# Patient Record
Sex: Female | Born: 1990 | Race: Black or African American | Hispanic: No | Marital: Single | Smoking: Current every day smoker
Health system: Southern US, Community
[De-identification: ages and names within clinical notes are randomized; demographics above are authoritative.]

## PROBLEM LIST (undated history)

## (undated) DIAGNOSIS — F319 Bipolar disorder, unspecified: Secondary | ICD-10-CM

## (undated) DIAGNOSIS — Z789 Other specified health status: Secondary | ICD-10-CM

## (undated) HISTORY — PX: NO PAST SURGERIES: SHX2092

---
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
## 2012-09-20 ENCOUNTER — Inpatient Hospital Stay (HOSPITAL_COMMUNITY)
Admission: AD | Admit: 2012-09-20 | Discharge: 2012-09-20 | Disposition: A | Discharge status: Home / Self Care | Payer: Self-pay | Source: Ambulatory Visit | Attending: Family Medicine | Admitting: Family Medicine

## 2012-09-20 ENCOUNTER — Inpatient Hospital Stay (HOSPITAL_COMMUNITY): Payer: Self-pay

## 2012-09-20 ENCOUNTER — Encounter (HOSPITAL_COMMUNITY): Payer: Self-pay | Admitting: *Deleted

## 2012-09-20 DIAGNOSIS — N949 Unspecified condition associated with female genital organs and menstrual cycle: Secondary | ICD-10-CM | POA: Insufficient documentation

## 2012-09-20 DIAGNOSIS — T8339XA Other mechanical complication of intrauterine contraceptive device, initial encounter: Secondary | ICD-10-CM | POA: Insufficient documentation

## 2012-09-20 DIAGNOSIS — T8332XA Displacement of intrauterine contraceptive device, initial encounter: Secondary | ICD-10-CM

## 2012-09-20 DIAGNOSIS — R109 Unspecified abdominal pain: Secondary | ICD-10-CM | POA: Insufficient documentation

## 2012-09-20 HISTORY — DX: Other specified health status: Z78.9

## 2012-09-20 LAB — URINALYSIS, ROUTINE W REFLEX MICROSCOPIC
Glucose, UA: NEGATIVE mg/dL
Leukocytes, UA: NEGATIVE
Nitrite: NEGATIVE
Protein, ur: NEGATIVE mg/dL

## 2012-09-20 LAB — URINE MICROSCOPIC-ADD ON

## 2012-09-20 LAB — WET PREP, GENITAL
Clue Cells Wet Prep HPF POC: NONE SEEN
Trich, Wet Prep: NONE SEEN
Yeast Wet Prep HPF POC: NONE SEEN

## 2012-09-20 LAB — POCT PREGNANCY, URINE: Preg Test, Ur: NEGATIVE

## 2012-09-20 NOTE — Progress Notes (Signed)
Written and verbal d/c instructions given and understanding voiced. Dr Thad Ranger went over d/c instructions in detail with pt. Clinic will call pt with appt.

## 2012-09-20 NOTE — MAU Note (Signed)
Pt did not have transportation home. Came in by EMS. Given bus pass. Security escorted pt to bus stop at [facility].
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
## 2012-09-20 NOTE — MAU Note (Signed)
Pain since Jan, in March had Mirena placed.abortion in Jan.  Pain has continued. Did not have follow up for either procedure due to transportation problems.  Pain is in lower   On left side.  Has had bleeding offf and on, at times passing clumps of tissue.  Can not feel strings, but never has.

## 2012-09-21 ENCOUNTER — Encounter: Payer: Self-pay | Admitting: Obstetrics & Gynecology

## 2012-09-21 LAB — GC/CHLAMYDIA PROBE AMP, GENITAL: Chlamydia, DNA Probe: NEGATIVE

## 2012-10-05 ENCOUNTER — Encounter: Payer: Self-pay | Admitting: Obstetrics & Gynecology

## 2014-07-15 IMAGING — US US PELVIS COMPLETE
1 series · 13 of 25 positions shown · non-contrast
Comparison: None

CLINICAL DATA: Left-sided pelvic pain.  Indwelling IUD, though the
string is not visible at physical examination.

TRANSABDOMINAL AND TRANSVAGINAL ULTRASOUND OF PELVIS
TECHNIQUE: Both transabdominal and transvaginal ultrasound
examinations of the pelvis were performed. Transabdominal technique
was performed for global imaging of the pelvis including uterus,
ovaries, adnexal regions, and pelvic cul-de-sac.
It was necessary to proceed with endovaginal exam following the
transabdominal exam to visualize the endometrium to localize the
IUD.

[Series 1: us pelvis complete · 47 acquisitions, 13 frames shown]
[im 1/47]
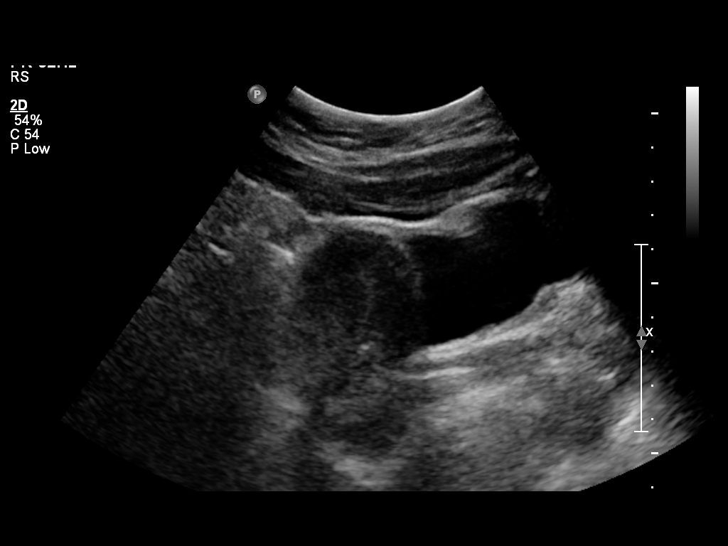
[im 4/47]
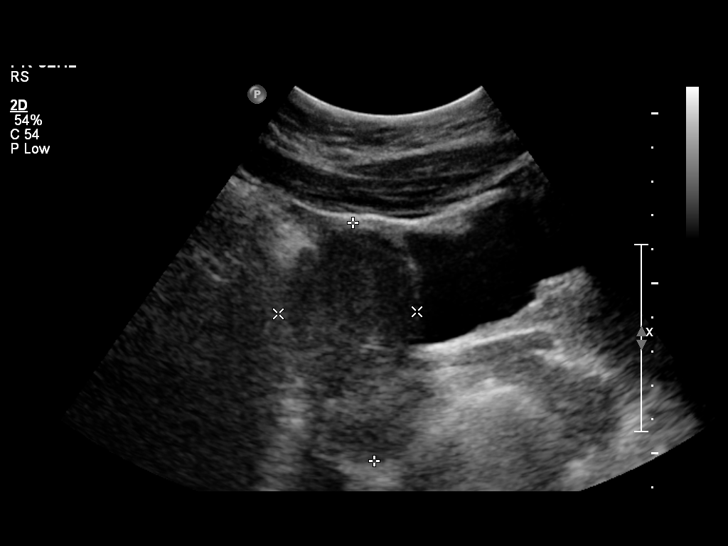
[im 8/47]
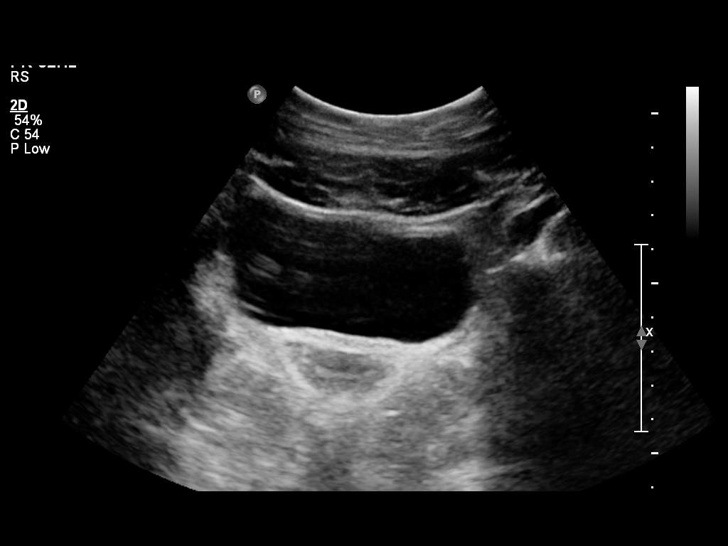
[im 12/47]
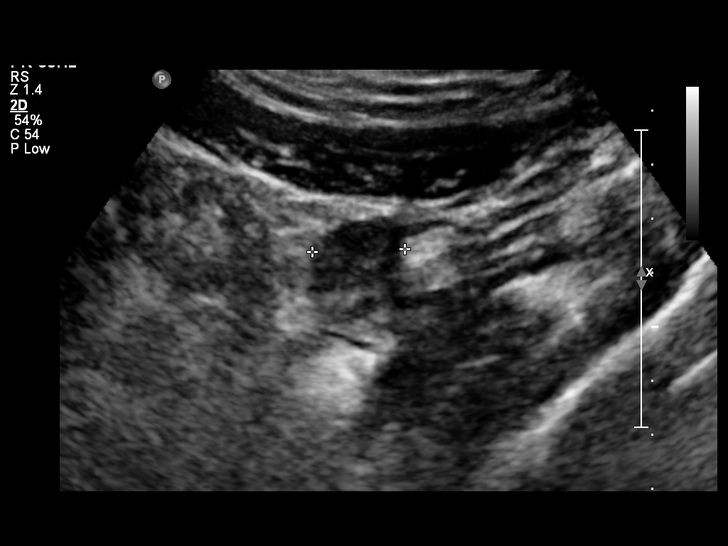
[im 16/47]
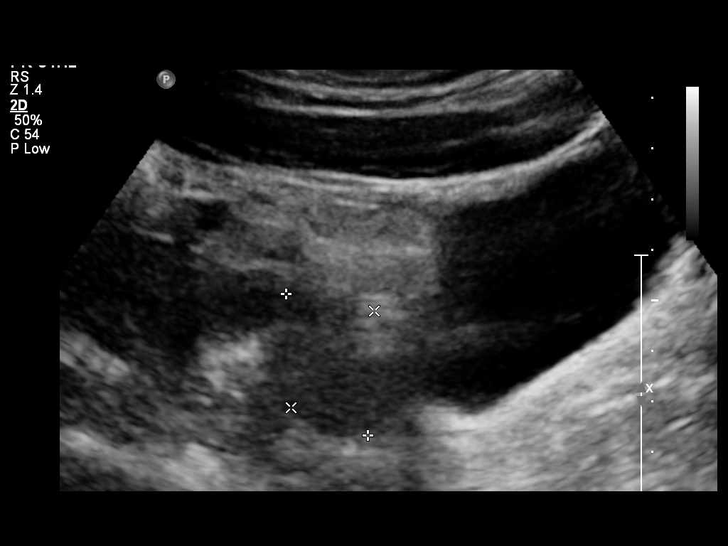
[im 20/47]
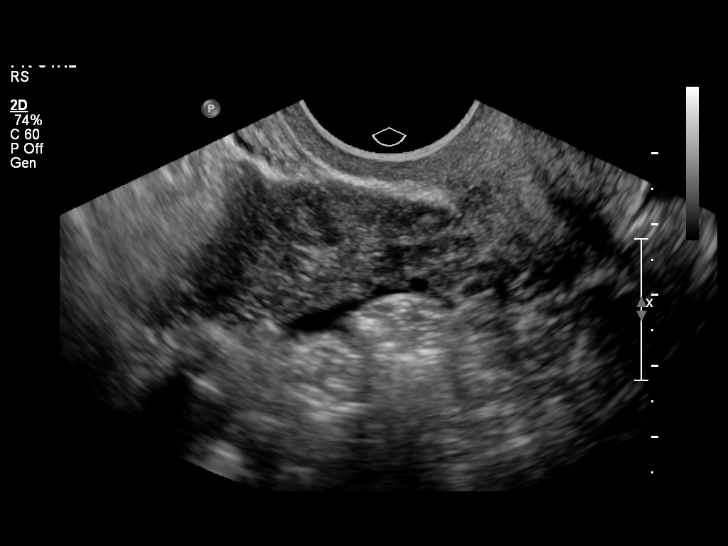
[im 24/47]
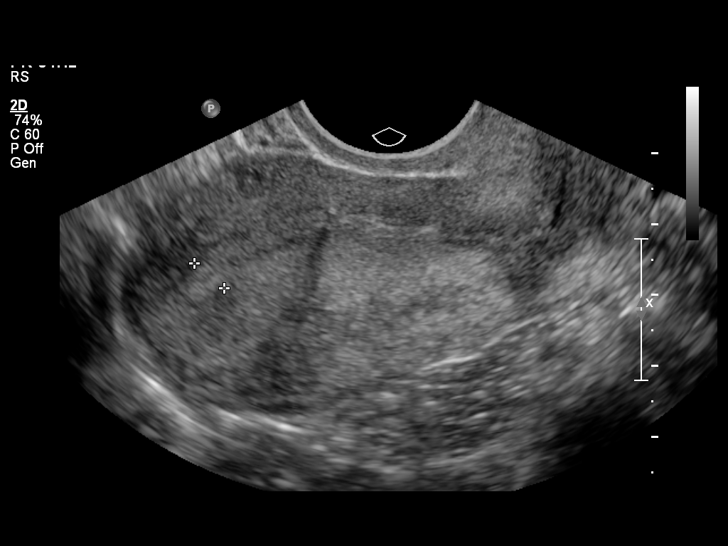
[im 27/47]
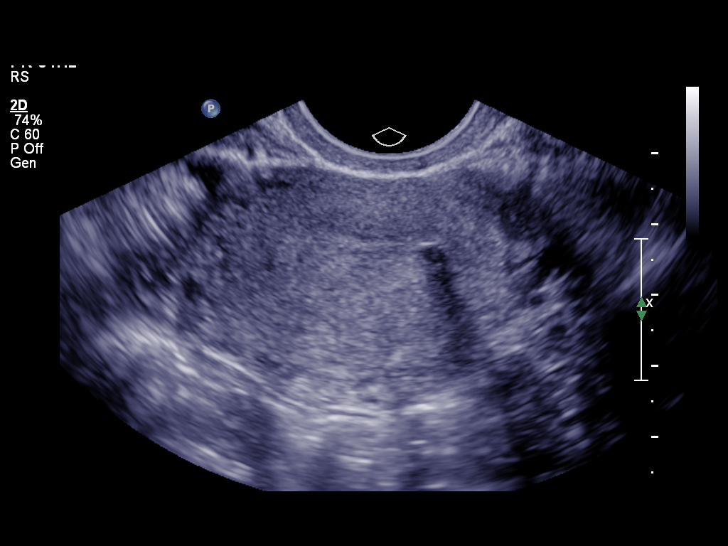
[im 31/47]
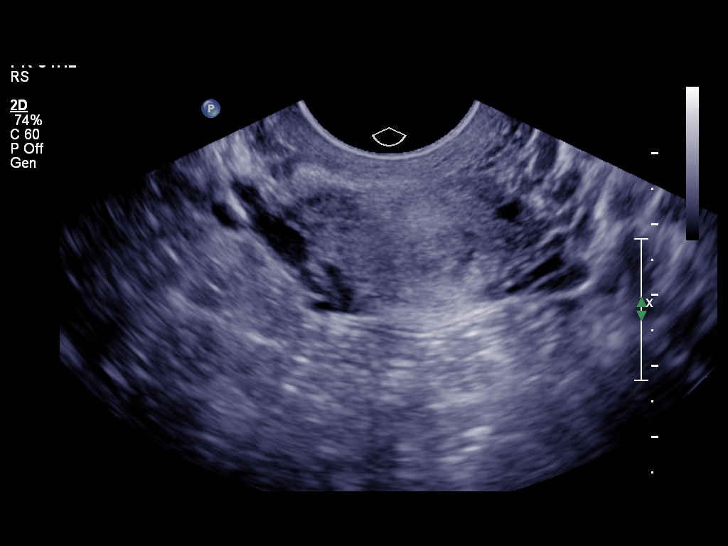
[im 35/47]
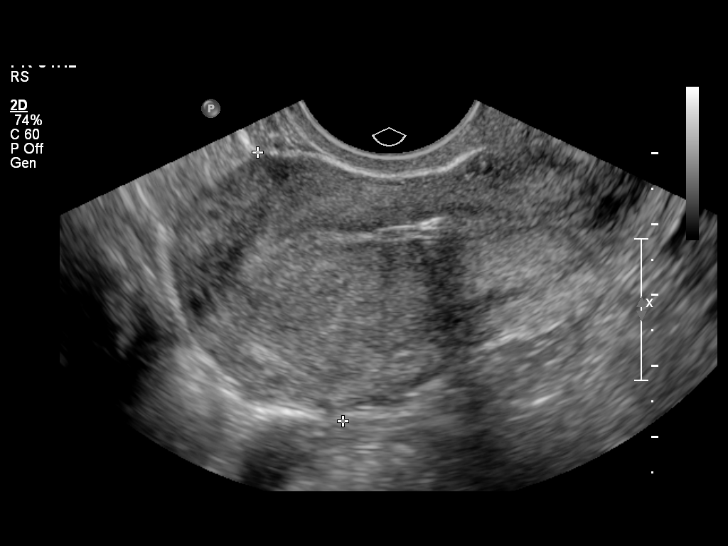
[im 39/47]
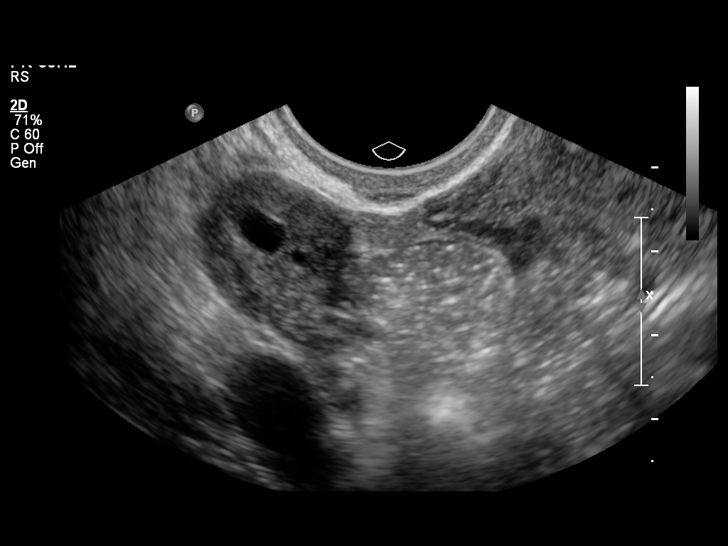
[im 43/47]
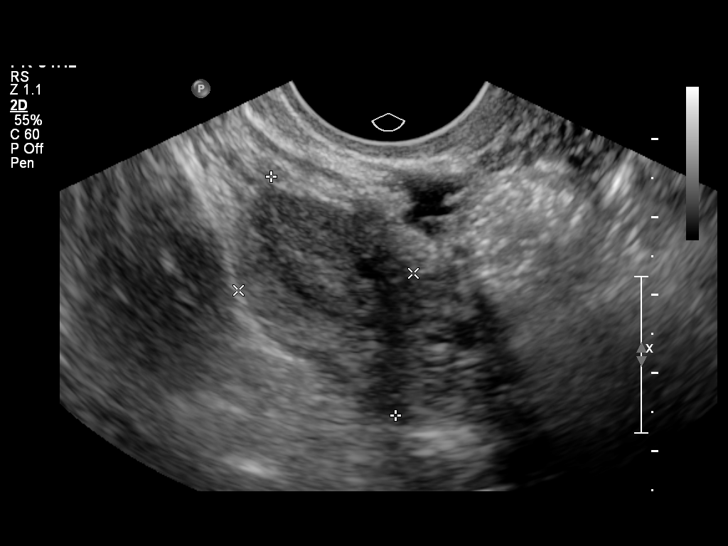
[im 47/47]
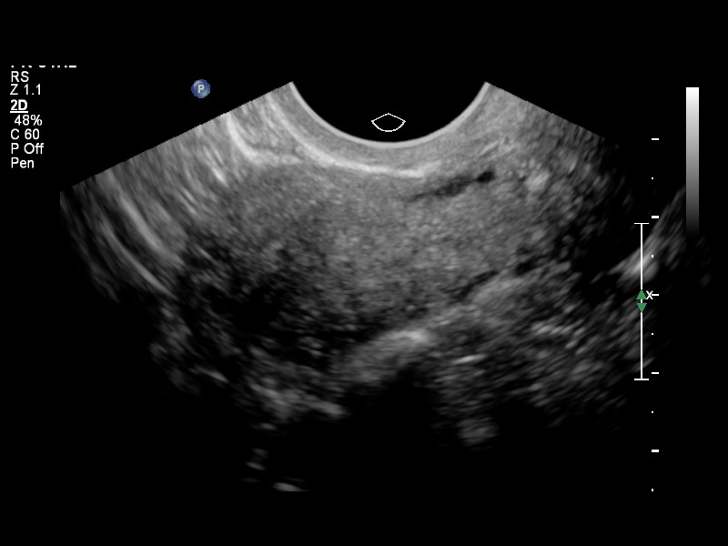

[13 of 25 positions shown; findings below may reference images not displayed]

FINDINGS: Uterus: Normal in size appearance measuring 7.0 x 4.1 x 5.6 cm.  No
focal myometrial abnormality.

Endometrium: Normal in thickness measuring 6 mm.  Intrauterine
device within the endometrial canal of the lower uterine segment,
not in the fundus.  This is very well demonstrated on the 3-D
reconstruction where there is suggestion that the right arm of the
IUD may be within the myometrium of the right lateral uterine body.

Right ovary:  Normal in size and appearance containing small
follicular cysts, measuring approximately 2.4 x 1.9 x 1.7 cm.
Normal power Doppler flow.

Left ovary: Normal in size and appearance containing small
follicular cysts, measuring approximately 3.5 x 2.3 x 2.1 cm.
Normal power Doppler flow.

Other findings: Trace, physiologic free fluid in the cul-de-sac.
IMPRESSION: 1.  Intrauterine device abnormally positioned within the
endometrial canal of the lower uterine segment.  3-D images raise
the question of whether the right arm of the IUD has migrated into
the myometrium of the right lateral uterine body.
2.  Otherwise normal examination.

## 2014-09-17 ENCOUNTER — Encounter (HOSPITAL_COMMUNITY): Payer: Self-pay | Admitting: *Deleted

## 2016-06-01 ENCOUNTER — Emergency Department (HOSPITAL_COMMUNITY)
Admission: EM | Admit: 2016-06-01 | Discharge: 2016-06-01 | Disposition: A | Discharge status: Other Acute Inpatient Hospital | Payer: Self-pay | Attending: Emergency Medicine | Admitting: Emergency Medicine

## 2016-06-01 ENCOUNTER — Encounter (HOSPITAL_COMMUNITY): Payer: Self-pay | Admitting: *Deleted

## 2016-06-01 ENCOUNTER — Encounter (HOSPITAL_COMMUNITY): Payer: Self-pay

## 2016-06-01 ENCOUNTER — Inpatient Hospital Stay (HOSPITAL_COMMUNITY)
Admission: AD | Admit: 2016-06-01 | Discharge: 2016-06-08 | DRG: 885 | Disposition: A | Discharge status: Home / Self Care | Payer: BLUE CROSS/BLUE SHIELD | Attending: Psychiatry | Admitting: Psychiatry

## 2016-06-01 DIAGNOSIS — F319 Bipolar disorder, unspecified: Secondary | ICD-10-CM | POA: Insufficient documentation

## 2016-06-01 DIAGNOSIS — F2089 Other schizophrenia: Secondary | ICD-10-CM | POA: Insufficient documentation

## 2016-06-01 DIAGNOSIS — Z818 Family history of other mental and behavioral disorders: Secondary | ICD-10-CM | POA: Diagnosis not present

## 2016-06-01 DIAGNOSIS — F312 Bipolar disorder, current episode manic severe with psychotic features: Secondary | ICD-10-CM | POA: Diagnosis present

## 2016-06-01 DIAGNOSIS — F17210 Nicotine dependence, cigarettes, uncomplicated: Secondary | ICD-10-CM | POA: Insufficient documentation

## 2016-06-01 DIAGNOSIS — F1210 Cannabis abuse, uncomplicated: Secondary | ICD-10-CM | POA: Diagnosis present

## 2016-06-01 DIAGNOSIS — Z79899 Other long term (current) drug therapy: Secondary | ICD-10-CM | POA: Insufficient documentation

## 2016-06-01 DIAGNOSIS — Z809 Family history of malignant neoplasm, unspecified: Secondary | ICD-10-CM

## 2016-06-01 DIAGNOSIS — Z833 Family history of diabetes mellitus: Secondary | ICD-10-CM

## 2016-06-01 DIAGNOSIS — G4700 Insomnia, unspecified: Secondary | ICD-10-CM | POA: Diagnosis present

## 2016-06-01 DIAGNOSIS — F1290 Cannabis use, unspecified, uncomplicated: Secondary | ICD-10-CM | POA: Insufficient documentation

## 2016-06-01 HISTORY — DX: Bipolar disorder, unspecified: F31.9

## 2016-06-01 LAB — SALICYLATE LEVEL: Salicylate Lvl: <4 mg/dL (ref 2.8–30.0)

## 2016-06-01 LAB — COMPREHENSIVE METABOLIC PANEL
ALBUMIN: 5.1 g/dL — AB (ref 3.5–5.0)
ALT: 22 U/L (ref 14–54)
ANION GAP: 12 (ref 5–15)
AST: 44 U/L — ABNORMAL HIGH (ref 15–41)
Alkaline Phosphatase: 49 U/L (ref 38–126)
BILIRUBIN TOTAL: 1.1 mg/dL (ref 0.3–1.2)
BUN: 13 mg/dL (ref 6–20)
CALCIUM: 9.7 mg/dL (ref 8.9–10.3)
CO2: 23 mmol/L (ref 22–32)
Chloride: 103 mmol/L (ref 101–111)
Creatinine, Ser: 0.82 mg/dL (ref 0.44–1.00)
GFR calc non Af Amer: >60 mL/min (ref >60)
GLUCOSE: 99 mg/dL (ref 65–99)
Potassium: 3.3 mmol/L — ABNORMAL LOW (ref 3.5–5.1)
Sodium: 138 mmol/L (ref 135–145)
TOTAL PROTEIN: 8 g/dL (ref 6.5–8.1)

## 2016-06-01 LAB — RAPID URINE DRUG SCREEN, HOSP PERFORMED
Amphetamines: NOT DETECTED
BARBITURATES: NOT DETECTED
Benzodiazepines: NOT DETECTED
COCAINE: NOT DETECTED
Opiates: NOT DETECTED
Tetrahydrocannabinol: POSITIVE — AB

## 2016-06-01 LAB — ACETAMINOPHEN LEVEL

## 2016-06-01 LAB — CBC
HEMATOCRIT: 40.4 % (ref 36.0–46.0)
Hemoglobin: 13.7 g/dL (ref 12.0–15.0)
MCH: 30 pg (ref 26.0–34.0)
MCHC: 33.9 g/dL (ref 30.0–36.0)
MCV: 88.4 fL (ref 78.0–100.0)
Platelets: 184 10*3/uL (ref 150–400)
RBC: 4.57 MIL/uL (ref 3.87–5.11)
RDW: 14.3 % (ref 11.5–15.5)
WBC: 8.6 10*3/uL (ref 4.0–10.5)

## 2016-06-01 LAB — ETHANOL: Alcohol, Ethyl (B): <5 mg/dL (ref <5)

## 2016-06-01 LAB — PREGNANCY, URINE: PREG TEST UR: NEGATIVE

## 2016-06-01 MED ORDER — CARBAMAZEPINE 200 MG PO TABS
200.0000 mg | ORAL_TABLET | Freq: Two times a day (BID) | ORAL | Status: DC
  Administered 2016-06-01 – 2016-06-08 (×14): 200 mg via ORAL
  Filled 2016-06-01 (×12): qty 1
  Filled 2016-06-01: qty 14
  Filled 2016-06-01: qty 1
  Filled 2016-06-01: qty 14
  Filled 2016-06-01 (×2): qty 1
  Filled 2016-06-01: qty 14
  Filled 2016-06-01 (×2): qty 1

## 2016-06-01 MED ORDER — BENZTROPINE MESYLATE 0.5 MG PO TABS
0.5000 mg | ORAL_TABLET | Freq: Two times a day (BID) | ORAL | Status: DC
  Administered 2016-06-01 – 2016-06-08 (×14): 0.5 mg via ORAL
  Filled 2016-06-01 (×13): qty 1
  Filled 2016-06-01 (×2): qty 14
  Filled 2016-06-01 (×2): qty 1
  Filled 2016-06-01: qty 14
  Filled 2016-06-01 (×2): qty 1

## 2016-06-01 MED ORDER — TRAZODONE HCL 100 MG PO TABS: 100.0000 mg | ORAL_TABLET | Freq: Every day | ORAL | Status: DC

## 2016-06-01 MED ORDER — HALOPERIDOL LACTATE 5 MG/ML IJ SOLN
5.0000 mg | Freq: Two times a day (BID) | INTRAMUSCULAR | Status: DC
  Filled 2016-06-01 (×10): qty 1

## 2016-06-01 MED ORDER — MAGNESIUM HYDROXIDE 400 MG/5ML PO SUSP: 30.0000 mL | Freq: Every day | ORAL | Status: DC | PRN

## 2016-06-01 MED ORDER — TRAZODONE HCL 50 MG PO TABS: 50.0000 mg | ORAL_TABLET | Freq: Every day | ORAL | Status: DC

## 2016-06-01 MED ORDER — BENZTROPINE MESYLATE 1 MG/ML IJ SOLN
0.5000 mg | Freq: Two times a day (BID) | INTRAMUSCULAR | Status: DC
  Filled 2016-06-01 (×18): qty 0.5

## 2016-06-01 MED ORDER — VITAMIN D3 25 MCG (1000 UNIT) PO TABS
1000.0000 [IU] | ORAL_TABLET | Freq: Every day | ORAL | Status: DC
  Administered 2016-06-01: 1000 [IU] via ORAL
  Filled 2016-06-01: qty 1

## 2016-06-01 MED ORDER — CARBAMAZEPINE 200 MG PO TABS: 200.0000 mg | ORAL_TABLET | Freq: Two times a day (BID) | ORAL | Status: DC

## 2016-06-01 MED ORDER — ADULT MULTIVITAMIN W/MINERALS CH
1.0000 | ORAL_TABLET | Freq: Every day | ORAL | Status: DC
  Administered 2016-06-01: 1 via ORAL
  Filled 2016-06-01: qty 1

## 2016-06-01 MED ORDER — BENZTROPINE MESYLATE 1 MG PO TABS
0.5000 mg | ORAL_TABLET | Freq: Two times a day (BID) | ORAL | Status: DC
Start: 2016-06-01 — End: 2016-06-01
  Administered 2016-06-01: 0.5 mg via ORAL
  Filled 2016-06-01: qty 1

## 2016-06-01 MED ORDER — TRAZODONE HCL 100 MG PO TABS
100.0000 mg | ORAL_TABLET | Freq: Every day | ORAL | Status: DC
  Filled 2016-06-01 (×7): qty 1

## 2016-06-01 MED ORDER — HALOPERIDOL 5 MG PO TABS
5.0000 mg | ORAL_TABLET | Freq: Two times a day (BID) | ORAL | Status: DC
Start: 2016-06-01 — End: 2016-06-04
  Administered 2016-06-01 – 2016-06-04 (×7): 5 mg via ORAL
  Filled 2016-06-01 (×11): qty 1

## 2016-06-01 MED ORDER — HALOPERIDOL 5 MG PO TABS
5.0000 mg | ORAL_TABLET | Freq: Two times a day (BID) | ORAL | Status: DC
  Administered 2016-06-01: 5 mg via ORAL
  Filled 2016-06-01: qty 1

## 2016-06-01 MED ORDER — ACETAMINOPHEN 325 MG PO TABS: 650.0000 mg | ORAL_TABLET | Freq: Four times a day (QID) | ORAL | Status: DC | PRN

## 2016-06-01 MED ORDER — ALUM & MAG HYDROXIDE-SIMETH 200-200-20 MG/5ML PO SUSP: 30.0000 mL | ORAL | Status: DC | PRN

## 2016-06-01 MED ORDER — LEVOTHYROXINE SODIUM 25 MCG PO TABS: 25.0000 ug | ORAL_TABLET | Freq: Every day | ORAL | Status: DC

## 2016-06-01 NOTE — ED Notes (Signed)
TTS at beside currently

## 2016-06-01 NOTE — ED Notes (Signed)
Report to Darel [place]Judy [place], patient currently with TTS

## 2016-06-01 NOTE — Tx Team (Signed)
Initial Interdisciplinary Treatment Plan   PATIENT STRESSORS: Legal issue Medication change or noncompliance   PATIENT STRENGTHS: Ability for insight Average or above average intelligence General fund of knowledge Physical Health Religious Affiliation   PROBLEM LIST: Problem List/Patient Goals Date to be addressed Date deferred Reason deferred Estimated date of resolution  "spirituality" 06/01/16     psychosis 06/01/16                                                DISCHARGE CRITERIA:  Ability to meet basic life and health needs Adequate post-discharge living arrangements Improved stabilization in mood, thinking, and/or behavior Motivation to continue treatment in a less acute level of care Need for constant or close observation no longer present Reduction of life-threatening or endangering symptoms to within safe limits Safe-care adequate arrangements made Verbal commitment to aftercare and medication compliance  PRELIMINARY DISCHARGE PLAN: Outpatient therapy Placement in alternative living arrangements  PATIENT/FAMIILY INVOLVEMENT: This treatment plan has been presented to and reviewed with the patient, Grace Morris, and/or family member,   The patient and family have been given the opportunity to ask questions and make suggestions.  Beatrix ShipperWright, Grace Morris 06/01/2016, 7:34 PM

## 2016-06-01 NOTE — ED Notes (Signed)
Mother, [name], can be reached at phone number [phone]

## 2016-06-01 NOTE — ED Notes (Signed)
Patient has been wanded. 

## 2016-06-01 NOTE — BH Assessment (Signed)
Requested urine sample from patient who states "satan get thee behind me, I know I peed earlier." Requested a urine sample again and walked patient to the restroom. Patient provided small sample which was given to the nurse.   Davina PokeJoVea Miken Stecher, LCSW Therapeutic Triage Specialist [facility] 06/01/2016 5:19 AM

## 2016-06-01 NOTE — ED Notes (Signed)
Patient states that was [place] tonight [place].  Patient states that had a disagreement with her mother and she stated that "just let my mother call the police, I love her and she knows what is best for me"  Patient denies A/V/H, patient also states that not having SI thoughts but, is tired and knows she needs to get some rest.

## 2016-06-01 NOTE — ED Provider Notes (Signed)
CSN: [id number]     Arrival date & time 06/01/16  0126 History  By signing my name below, I, Bethel Born, attest that this documentation has been prepared under the direction and in the presence of Shon Baton, MD. Electronically Signed: Bethel Born, ED Scribe. 06/01/2016. 2:38 AM   Chief Complaint  Patient presents with  . Mental Health Problem   The history is provided by the patient. No language interpreter was used.   Brought in by [employer], SWAY GUTTIERREZ is a 25 y.o. female with history of bipolar disorder who presents to the Emergency Department for psychiatric evaluation after her mother called the police because she was being loud and disorderly on the front lawn. Pt states that she and her mother had a verbal disagreement and her mother called the police because she knows that she is tired and needs to get some rest. Pt states that she has is trying to serve [employer] as a Jehov[employer]'s Witness buy has "impure thoughts a lot" and is unable to "think straight".  Pt denies any pain, SI/HI, and auditory hallucinations. She also denies any alcohol or drug use.   I have reviewed the IVC paperwork filled out by her mother. Patient has a history of paranoid schizophrenia.  Recent multiple hospitalizations. Prior suicide attempts. Per the mother based on the IVC paperwork, patient stated "you're the devil and I'm going to kill you." Patient denies any hallucinations to me.  Past Medical History  Diagnosis Date  . No pertinent past medical history   . Bipolar disorder [facility])    Past Surgical History  Procedure Laterality Date  . No past surgeries     Family History  Problem Relation Age of Onset  . Other Neg Hx   . Cancer Paternal Uncle   . Diabetes Paternal Grandmother    Social History  Substance Use Topics  . Smoking status: Current Every Day Smoker  . Smokeless tobacco: Never Used  . Alcohol Use: Yes     Comment: 1 drink every month   OB History    Gravida Para Term  Preterm AB TAB SAB Ectopic Multiple Living   1    1  0   0     Review of Systems  Psychiatric/Behavioral: Positive for behavioral problems and confusion. Negative for suicidal ideas and hallucinations. The patient is not nervous/anxious.   All other systems reviewed and are negative.   Allergies  Review of patient's allergies indicates no known allergies.  Home Medications   Prior to Admission medications   Medication Sig Start Date End Date Taking? Authorizing Provider  Biotin 10 MG CAPS Take 1 tablet by mouth daily.    Historical Provider, MD  fish oil-omega-3 fatty acids 1000 MG capsule Take 2 g by mouth daily.    Historical Provider, MD  Multiple Vitamins-Minerals (MULTIVITAMIN WITH MINERALS) tablet Take 1 tablet by mouth daily.    Historical Provider, MD   BP 111/81 mmHg  Pulse 77  Temp(Src) 98 F (36.7 C) (Oral)  Resp 18  SpO2 100% Physical Exam  Constitutional: She is oriented to person, place, and time. She appears well-developed and well-nourished. No distress.  HENT:  Head: Normocephalic and atraumatic.  Cardiovascular: Normal rate, regular rhythm and normal heart sounds.   Pulmonary/Chest: Effort normal and breath sounds normal. No respiratory distress. She has no wheezes.  Neurological: She is alert and oriented to person, place, and time.  Skin: Skin is warm and dry.  Psychiatric: She has a normal  mood and affect.  Calm, cooperative, denies hallucinations, odd affect and repetitively states "I just feel very tired."  Nursing note and vitals reviewed.   ED Course  Procedures (including critical care time) DIAGNOSTIC STUDIES: Oxygen Saturation is 100% on RA,  normal by my interpretation.    COORDINATION OF CARE: 1:55 AM Discussed treatment plan which includes lab work and TTS consult with pt at bedside and pt agreed to plan.  2:37 AM Per IVC paperwork the pt has history of paranoid schizophrenia, hallucinations, and suicide attempt. Tonight the pt reportedly  told her mother that she was the devil and that she was going to kill her.   Labs Review Labs Reviewed  COMPREHENSIVE METABOLIC PANEL - Abnormal; Notable for the following:    Potassium 3.3 (*)    Albumin 5.1 (*)    AST 44 (*)    All other components within normal limits  ACETAMINOPHEN LEVEL - Abnormal; Notable for the following:    Acetaminophen (Tylenol), Serum <10 (*)    All other components within normal limits  ETHANOL  SALICYLATE LEVEL  CBC  URINE RAPID DRUG SCREEN, HOSP PERFORMED    Imaging Review No results found. I have personally reviewed and evaluated these  lab results as part of my medical decision-making.   EKG Interpretation None      MDM   Final diagnoses:  Other schizophrenia (HCC)   Patient with history of schizophrenia. IVC paperwork filled out by mother for aggression and homicidal ideation. Also reports hallucinations. Screening lab work in place. TTS consulted.  Patient medically clear for evaluation.  I personally performed the services described in this documentation, which was scribed in my presence. The recorded information has been reviewed and is accurate.    Shon Batonourtney F Horton, MD 06/01/16 [phone]

## 2016-06-01 NOTE — BH Assessment (Signed)
[facility] Assessment Progress Note  Per Mojeed Akintayo,MD, this pt requires psychiatric hospitalization at this time. Berneice Heinrichina Tate, RN, [facility]. Pt presents under IVC initiated by her mother and upheld by Dr Jannifer FranklinAkintayo, and [facility]. Pt's nurse, Rudean HittDawnaly, has been notified, and agrees to call report to [phone]. Pt is to be transported via [employer].  Doylene Canninghomas Bernette Seeman, MA Triage Specialist [phone]

## 2016-06-01 NOTE — ED Notes (Signed)
Patient noted in room. No complaints, stable, in no acute distress. Reports AH, and SI,  Q15 minute rounds and monitoring via security cameras continue for safety.

## 2016-06-01 NOTE — ED Notes (Signed)
[facility]  All belongings given to the transporting officer.  She left the unit ambulatory with [employer].
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
## 2016-06-02 DIAGNOSIS — F312 Bipolar disorder, current episode manic severe with psychotic features: Principal | ICD-10-CM

## 2016-06-02 LAB — LIPID PANEL
Cholesterol: 123 mg/dL (ref 0–200)
HDL: 34 mg/dL — ABNORMAL LOW (ref >40)
LDL CALC: 77 mg/dL (ref 0–99)
Total CHOL/HDL Ratio: 3.6 RATIO
Triglycerides: 59 mg/dL (ref <150)
VLDL: 12 mg/dL (ref 0–40)

## 2016-06-02 LAB — TSH: TSH: 2.382 u[IU]/mL (ref 0.350–4.500)

## 2016-06-02 NOTE — Progress Notes (Signed)
Pt was isolative and withdrawn to room; was in bed sleep all evening. Pt, a new admit was difficult to arouse. At the time of assessment, Pt wasn't in any distress. Support, encouragement, and safe environment provided.  15-minute safety checks continue.

## 2016-06-02 NOTE — Progress Notes (Signed)
D) Pt has been isolative to room. Seclusive to self. Pt is negative for groups and activities this shift however does go to meals with prompting. Affect is blank, or preoccupied. Pt is guarded about AVH stating "I just need to sleep". Compliant with medication. A) Level 3 obs for safety, support and encouragement provided. Prompts as needed. Med ed reinforced. R) Guarded, seclusive to self.

## 2016-06-02 NOTE — Progress Notes (Signed)
Recreation Therapy Notes  Animal-Assisted Activity (AAA) Program Checklist/Progress Notes Patient Eligibility Criteria Checklist & Daily Group note for Rec Tx Intervention  Date: 07.18.2017 Time: 2:45pm [address]    AAA/T Program Assumption of Risk Form signed by Patient/ or Parent Legal Guardian Yes  Patient is free of allergies or sever asthma Yes  Patient reports no fear of animals Yes  Patient reports no history of cruelty to animals Yes  Patient understands his/her participation is voluntary Yes  Patient washes hands before animal contact Yes  Patient washes hands after animal contact Yes  Behavioral Response: Appropriate, Attentive.   Education: Charity fundraiserHand Washing, Health visitorAppropriate Animal Interaction   Education Outcome: Acknowledges education.   Clinical Observations/Feedback: Patient discussed with NP for appropriateness in pet therapy session. Both LRT and NP agree patient is appropriate for participation. Patient offered participation in session and signed necessary consent form without issue. Patient engaged appropriately in session, petting therapy dog and interacting with peers appropriately.   Grace Morris, LRT/CTRS        Jearl KlinefelterBlanchfield, Grace Ange L 06/02/2016 3:19 PM

## 2016-06-02 NOTE — H&P (Addendum)
Psychiatric Admission Assessment Adult  Patient Identification: Grace Morris MRN:  [id number] Date of Evaluation:  06/02/2016 Chief Complaint:  BIOLAR DISORDER, CURRENT EPISODE MANIS, SEVERE WITH PSYCHOTIC FEATURES Principal Diagnosis: Bipolar disorder, current episode manic, severe with psychotic features (HCC) Diagnosis:   Patient Active Problem List   Diagnosis Date Noted  . Bipolar disorder, current episode manic, severe with psychotic features (HCC) [F31.2] 06/01/2016    Priority: High  . Bipolar affective disorder, current episode manic with psychotic symptoms (HCC) [F31.2] 06/01/2016   History of Present Illness: Patient is a 25 year old female transferred from [place] long ED involuntarily for stabilization of dangerous disruptive behaviors, which includes her standing out in the front lawn of her mom's house yelling and being bizarre. Patient reported that she heard voices telling her to do bad things. Patient also reports this morning that she is trying to heal herself, wants to serve [employer]. 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  Patient reports that she's not been able to sleep well at night, is awake most of the times. She adds that she has difficulty in concentration, has difficulty in keeping up with her thoughts. She also reports being irritable, being sad and tearful at times, feeling that no one really cares for her. She denies any symptoms of anxiety or panic attacks. On being questioned how long this was going on, patient stated that she's not sure, recently moved from [place] to [place] to be with her mom.  She also gives history of marijuana and alcohol use starting at age 36. Reports that she's taking Percocets in the past to help. She states that she's used all the substances in the last week.Paperwork sts that pt drinks at least 1-40 oz beer and uses marijuana daily.   Patient denies any history of physical or sexual abuse. She does report that  she has legal charges pending in [place] for assault. Associated Signs/Symptoms: Depression Symptoms:  depressed mood, insomnia, loss of energy/fatigue, disturbed sleep, (Hypo) Manic Symptoms:  Distractibility, Flight of Ideas, Hallucinations, Impulsivity, Irritable Mood, Labiality of Mood, Anxiety Symptoms:  Obsessive Compulsive Symptoms:   None,, Psychotic Symptoms:  No history of physical or sexual abuse PTSD Symptoms: Negative Total Time spent with patient: 1 hour  Past Psychiatric History: Patient has been  Is the patient at risk to self? Yes.    Has the patient been a risk to self in the past 6 months? Yes.    Has the patient been a risk to self within the distant past? Yes.    Is the patient a risk to others? Yes.    Has the patient been a risk to others in the past 6 months? Yes.    Has the patient been a risk to others within the distant past? Yes.     Prior Inpatient Therapy:   Prior Outpatient Therapy:    Alcohol Screening: 1. How often do you have a drink containing alcohol?: 2 to 3 times a week 2. How many drinks containing alcohol do you have on a typical day when you are drinking?: 1 or 2 3. How often do you have six or more drinks on one occasion?: Never Preliminary Score: 0 9. Have you or someone else been injured as a result of your drinking?: No 10. Has a relative or friend or a doctor or another health worker been concerned about your drinking or suggested you cut down?: No Alcohol Use Disorder Identification Test Final Score (AUDIT): 3 Brief Intervention:  AUDIT score less than 7 or less-screening does not suggest unhealthy drinking-brief intervention not indicated Substance Abuse History in the last 12 months:  Yes.   Consequences of Substance Abuse: Legal Consequences:  Has charges pending against her Previous Psychotropic Medications: Yes  Psychological Evaluations: No  Past Medical History:  Past Medical History  Diagnosis Date  . No pertinent  past medical history   . Bipolar disorder [facility])     Past Surgical History  Procedure Laterality Date  . No past surgeries     Family History:  Family History  Problem Relation Age of Onset  . Other Neg Hx   . Cancer Paternal Uncle   . Diabetes Paternal Grandmother    Family Psychiatric  History: None reported by patient Tobacco Screening: ([phone])::1)@ Social History:  History  Alcohol Use  . Yes    Comment: 1 drink every month     History  Drug Use  . Yes  . Special: Marijuana    Additional Social History:                           Allergies:   Allergies  Allergen Reactions  . Lithium Nausea And Vomiting   Lab Results:  Results for orders placed or performed during the hospital encounter of 06/01/16 (from the past 48 hour(s))  TSH     Status: None   Collection Time: 06/02/16  6:25 AM  Result Value Ref Range   TSH 2.382 0.350 - 4.500 uIU/mL    Comment: Performed at [facility]  Lipid panel     Status: Abnormal   Collection Time: 06/02/16  6:25 AM  Result Value Ref Range   Cholesterol 123 0 - 200 mg/dL   Triglycerides 59 <536 mg/dL   HDL 34 (L) >64 mg/dL   Total CHOL/HDL Ratio 3.6 RATIO   VLDL 12 0 - 40 mg/dL   LDL Cholesterol 77 0 - 99 mg/dL    Comment:        Total Cholesterol/HDL:CHD Risk Coronary Heart Disease Risk Table                     Men   Women  1/2 Average Risk   3.4   3.3  Average Risk       5.0   4.4  2 X Average Risk   9.6   7.1  3 X Average Risk  23.4   11.0        Use the calculated Patient Ratio above and the CHD Risk Table to determine the patient's CHD Risk.        ATP III CLASSIFICATION (LDL):  <100     mg/dL   Optimal  403-474  mg/dL   Near or Above                    Optimal  130-159  mg/dL   Borderline  259-563  mg/dL   High  >875     mg/dL   Very High Performed at [facility]     Blood Alcohol level:  Lab Results  Component Value Date   [facility] <5 06/01/2016     Metabolic Disorder Labs:  No results found for: HGBA1C, MPG No results found for: PROLACTIN Lab Results  Component Value Date   CHOL 123 06/02/2016   TRIG 59 06/02/2016   HDL 34* 06/02/2016   CHOLHDL 3.6 06/02/2016   VLDL 12 06/02/2016  LDLCALC 77 06/02/2016    Current Medications: Current Facility-Administered Medications  Medication Dose Route Frequency Provider Last Rate Last Dose  . acetaminophen (TYLENOL) tablet 650 mg  650 mg Oral Q6H PRN Nelly Rout, MD      . alum & mag hydroxide-simeth (MAALOX/MYLANTA) 200-200-20 MG/5ML suspension 30 mL  30 mL Oral Q4H PRN Nelly Rout, MD      . benztropine (COGENTIN) tablet 0.5 mg  0.5 mg Oral BID Nelly Rout, MD   0.5 mg at 06/02/16 0831   Or  . benztropine mesylate (COGENTIN) injection 0.5 mg  0.5 mg Intramuscular BID Nelly Rout, MD      . carbamazepine (TEGRETOL) tablet 200 mg  200 mg Oral BID Nelly Rout, MD   200 mg at 06/02/16 0831  . haloperidol (HALDOL) tablet 5 mg  5 mg Oral BID Nelly Rout, MD   5 mg at 06/02/16 0831   Or  . haloperidol lactate (HALDOL) injection 5 mg  5 mg Intramuscular BID Nelly Rout, MD      . magnesium hydroxide (MILK OF MAGNESIA) suspension 30 mL  30 mL Oral Daily PRN Nelly Rout, MD      . traZODone (DESYREL) tablet 100 mg  100 mg Oral QHS Nelly Rout, MD   100 mg at 06/01/16 2200   PTA Medications: Prescriptions prior to admission  Medication Sig Dispense Refill Last Dose  . cholecalciferol (VITAMIN D) 1000 units tablet Take 1,000 Units by mouth daily.   ~1 week ago  . DULoxetine (CYMBALTA) 30 MG capsule Take 30 mg by mouth daily.   ~1 week ago  . levothyroxine (SYNTHROID, LEVOTHROID) 25 MCG tablet Take 25 mcg by mouth daily before breakfast.   ~1 week ago  . lurasidone (LATUDA) 40 MG TABS tablet Take 40 mg by mouth daily with breakfast.   ~1 week ago  . Multiple Vitamin (MULTIVITAMIN WITH MINERALS) TABS tablet Take 1 tablet by mouth daily.   ~1 week ago     Musculoskeletal: Strength & Muscle Tone: within normal limits Gait & Station: normal Patient leans: N/A  Psychiatric Specialty Exam: Physical Exam  Review of Systems  Constitutional: Negative.  Negative for fever, weight loss and malaise/fatigue.  HENT: Negative.  Negative for congestion and sore throat.   Eyes: Negative.  Negative for blurred vision, double vision and redness.  Respiratory: Negative.  Negative for cough, shortness of breath and wheezing.   Cardiovascular: Negative.  Negative for chest pain and palpitations.  Gastrointestinal: Negative.  Negative for heartburn, nausea, vomiting, abdominal pain, diarrhea and constipation.  Genitourinary: Negative.  Negative for dysuria and urgency.  Musculoskeletal: Negative.  Negative for myalgias and falls.  Skin: Negative.  Negative for rash.  Neurological: Negative.  Negative for dizziness, seizures, loss of consciousness, weakness and headaches.  Endo/Heme/Allergies: Negative.  Negative for environmental allergies.  Psychiatric/Behavioral: Positive for hallucinations. Negative for depression. The patient has insomnia.     Blood pressure 127/88, pulse 100, temperature 98.3 F (36.8 C), temperature source Oral, resp. rate 16, height 5\' 1"  (1.549 m), weight 65.318 kg (144 lb), last menstrual period 06/01/2016.Body mass index is 27.22 kg/(m^2).  General Appearance: Disheveled  Eye Contact:  Fair  Speech:  Pressured  Volume:  Increased  Mood:  Euphoric and Irritable  Affect:  Labile  Thought Process:  Disorganized and Irrelevant  Orientation:  Full (Time, Place, and Person)  Thought Content:  Delusions and Hallucinations: Auditory  Suicidal Thoughts:  No  Homicidal Thoughts:  No  Memory:  Immediate;  Poor Recent;   Poor Remote;   Poor  Judgement:  Poor  Insight:  Lacking  Psychomotor Activity:  Increased and Mannerisms  Concentration:  Concentration: Poor and Attention Span: Poor  Recall:  Poor  Fund of Knowledge:   Fair  Language:  Fair  Akathisia:  No  Handed:  Right  AIMS (if indicated):     Assets:  Desire for Improvement Housing Social Support  ADL's:  Impaired  Cognition:  Impaired,  Moderate  Sleep:  Number of Hours: 6.75       Treatment Plan Summary: Daily contact with patient to assess and evaluate symptoms and progress in treatment and Medication management  Observation Level/Precautions:  15 minute checks  Laboratory:  To review labs done in the ED  Psychotherapy:  Patient to participate in therapeutic milieu, work on coping skills, communication skills training and also participate in substance abuse groups   Medications:  He should started on how to all 5 mg by mouth/IM twice a day, Tegretol 200 mg twice a day for mood stabilization, Cogentin 0.5 mg by mouth or IM twice a day for EPS and trazodone and admitted grams at bedtime for sleep   Consultations:  None at this time   Discharge Concerns:  Stabilize patient's mood and psychosis so that she can safely and effectively participate in outpatient treatment. Also on discharge patient needs substance abuse treatment   Estimated LOS:5-7 days   Other:  Agree with the physical exam done in the ED , based on ROS done today   I certify that inpatient services furnished can reasonably be expected to improve the patient's condition.    Nelly RoutKUMAR,Terika Pillard, MD 7/18/201711:47 AM

## 2016-06-02 NOTE — [facility] Suicide Risk Assessment (Signed)
[facility]   Nursing information obtained from:  Patient Demographic factors:  Unemployed Current Mental Status:  NA Loss Factors:  Loss of significant relationship, Legal issues Historical Factors:  Prior suicide attempts, Family history of mental illness or substance abuse, Domestic violence Risk Reduction Factors:  NA  Total Time spent with patient: 1 hour Principal Problem: Bipolar disorder, current episode manic, severe with psychotic features (HCC) Diagnosis:   Patient Active Problem List   Diagnosis Date Noted  . Bipolar disorder, current episode manic, severe with psychotic features (HCC) [F31.2] 06/01/2016    Priority: High  . Bipolar affective disorder, current episode manic with psychotic symptoms (HCC) [F31.2] 06/01/2016   Subjective Data: Please see H&P which is completed by me  Continued Clinical Symptoms:  Alcohol Use Disorder Identification Test Final Score (AUDIT): 3 The "Alcohol Use Disorders Identification Test", Guidelines for Use in Primary Care, Second Edition.  World Science writerHealth [employer] [facility](WHO). Score between 0-7:  no or low risk or alcohol related problems. Score between 8-15:  moderate risk of alcohol related problems. Score between 16-19:  high risk of alcohol related problems. Score 20 or above:  warrants further diagnostic evaluation for alcohol dependence and treatment.   CLINICAL FACTORS:   Bipolar Disorder:   Mixed State   Musculoskeletal: Strength & Muscle Tone: within normal limits Gait & Station: normal Patient leans: N/A  Psychiatric Specialty Exam: Physical Exam  ROS  Blood pressure 127/88, pulse 100, temperature 98.3 F (36.8 C), temperature source Oral, resp. rate 16, height 5\' 1"  (1.549 m), weight 65.318 kg (144 lb), last menstrual period 06/01/2016.Body mass index is 27.22 kg/(m^2).                                                          COGNITIVE FEATURES THAT CONTRIBUTE TO  RISK:  Loss of executive function    SUICIDE RISK:   Severe:  Frequent, intense, and enduring suicidal ideation, specific plan, no subjective intent, but some objective markers of intent (i.e., choice of lethal method), the method is accessible, some limited preparatory behavior, evidence of impaired self-control, severe dysphoria/symptomatology, multiple risk factors present, and few if any protective factors, particularly a lack of social support.  PLAN OF CARE: Please see H&P for details  I certify that inpatient services furnished can reasonably be expected to improve the patient's condition.   Nelly RoutKUMAR,Davonn Flanery, MD 06/02/2016, 12:04 PM

## 2016-06-02 NOTE — Tx Team (Addendum)
Interdisciplinary Treatment Plan Update (Adult) Date: 06/02/2016   Date: 06/02/2016 1:52 PM  Progress in Treatment:  Attending groups: Pt is new to milieu, continuing to assess  Participating in groups: Pt is new to milieu, continuing to assess  Taking medication as prescribed: Yes  Tolerating medication: Yes  Family/Significant othe contact made: No CSW assessing for appropriate contact Patient understands diagnosis: Continuing to assess Discussing patient identified problems/goals with staff: Yes  Medical problems stabilized or resolved: Yes  Denies suicidal/homicidal ideation: Yes Patient has not harmed self or Others: Yes   New problem(s) identified: None identified at this time.   Discharge Plan or Barriers: Pt will return home and follow-up with outpatient services.   Additional comments: Grace Morris is an 25 y.o.single female who was brought into the [facility] tonight involuntarily by the [employer] due to bizarre behavior she displayed at her mother's home where she was out on the front lawn "yelling and being disorderly." Mom petitioned for the IVC. IVC paperwork sts that Pt has been diagnosed with Paranoid Schizophrenia and was IVC'd in 2015 and 2016. Paperwork sts that pt hears voices and the voices tell her "to do bad things."  Haldol, Tegretol trial with Haldol IM ordered if pt refuses PO   Reason for Continuation of Hospitalization:  Anxiety Depression Medication stabilization Suicidal ideation Withdrawal symptoms Hallucinations  Estimated length of stay: 5-7 days  Review of initial/current patient goals per problem list:   1.  Goal(s): Patient will participate in aftercare plan  Met:  Yes  Target date: 3-5 days from date of admission   As evidenced by: Patient will participate within aftercare plan AEB aftercare provider and housing plan at discharge being identified.   06/02/16: Pt will return home and follow-up with outpatient services.   6. Goal (s):  Patient will  demonstrate decreased signs of mania . Met: No . Target date: 3-5 days from date of admission  . As evidenced by:  Patient demonstrate decreased signs of mania AEB decreased mood instability and demonstration of stable mood    06/02/16: Pt admitted with elevated mood, hyper-religiosity, and appears to be responding to internal stimuli  Attendees:  Patient:    Family:    Physician: Dr. Dwyane Dee, MD  06/02/2016 1:52 PM  Nursing: Verner Chol., RN 06/02/2016 1:52 PM  Clinical Social Worker Peri Maris, Shiri Hodapp Hobbs 06/02/2016 1:52 PM  Other: 06/02/2016 1:52 PM  Clinical: Lars Pinks, RN Case manager  06/02/2016 1:52 PM  Other:  06/02/2016 1:52 PM  Other:     Ripley Fraise

## 2016-06-02 NOTE — [facility] Group Notes (Signed)
[facility] LCSW Group Therapy Note  06/02/2016 2:45pm  Type of Therapy and Topic: Group Therapy: Holding onto Grudges   Pt did not attend, declined invitation.    Santia Labate Carter, LCSWA 06/02/2016 2:20 PM  

## 2016-06-02 NOTE — Progress Notes (Signed)
Adult Psychoeducational Group Note  Date:  06/02/2016 Time:  10:09 PM  Group Topic/Focus:  Wrap-Up Group:   The focus of this group is to help patients review their daily goal of treatment and discuss progress on daily workbooks.  Participation Level:  Active  Participation Quality:  Appropriate  Affect:  Appropriate  Cognitive:  Alert, Appropriate and Oriented  Insight: Appropriate  Engagement in Group:  Engaged  Modes of Intervention:  Discussion and Support  Additional Comments:  Patient attended group and she indicated that her day was a 10.  Her goal for today was to rest and that was accomplished. Her coping skills were spiritual reading and prayers.  Grace Morris [name] 06/02/2016, 10:09 PM

## 2016-06-03 LAB — HEMOGLOBIN A1C
Hgb A1c MFr Bld: 4.9 % (ref 4.8–5.6)
MEAN PLASMA GLUCOSE: 94 mg/dL

## 2016-06-03 NOTE — Progress Notes (Signed)
Patient up and visible in milieu. Affect is anxious with congruent mood.  Asking frequently about medications. Frequently says, "I don't have a seizure disorder. I'm taking something for seizures but is that for my bipolar?" Rating her depression at 0/10, hopelessness at a 0/10 and anxiety at a 0/10. Rates her sleep as good, appetite as fair, energy level as normal and concentration as good. Denies pain and physical problems. Reports goal is to "rest and reflect on positive things." Patient denies SI/HI/AVH.   Medicated per orders, no prns given or requested. Frequent med education given. Emotional support offered. Self inventory reviewed.  Patient verbalizes understanding. Will continue to re-educate as needed. Patient remains safe on level III obs.

## 2016-06-03 NOTE — Progress Notes (Signed)
D Pt. Has been in the dayroom this PM,  Attending group etc.  Pt denies A and VH, denies SI and HI, no reports of  pain or discomfort noted at present time.  A Writer offered support and encouragement, discussed pt.'s day.  R Pt. Rated her day a 10, denies any depression or anger, and rated her anxiety a 3.  Pt. Stated she had slept during the day and that had been her goal to rest because she had not been sleeping prior to admission.  Pt. Remains safe on the unit.  Pt. Did not want the trazodone for sleep but stated she would come back and ask for it if she had any difficulty falling asleep.

## 2016-06-03 NOTE — [facility] Group Notes (Signed)
[facility]  06/03/2016 , 12:07 PM    Type of Therapy:  [facility] Presentation  Participation Level:  Active  Participation Quality:  Attentive  Affect:  Blunted  Cognitive:  Oriented  Insight:  Limited  Engagement in Therapy:  Engaged  Modes of Intervention:  Discussion, Education and Socialization  Summary of Progress/Problems:  Onalee HuaDavid from [employer] came to present his recovery story and play the guitar.  Sat quietly while another patient braided her hair.  Stayed for the entire time, engaged throughout.  [name], Ezrah Dembeck B 06/03/2016 , 12:07 PM

## 2016-06-03 NOTE — Progress Notes (Signed)
[facility] MD Progress Note  06/03/2016 12:55 PM Grace Morris  MRN:  [id number] Subjective:  Pt states: "I'm doing so much better now. I was off meds for awhile and I really needed them. I came down here from [place] to be near my mother and grandmother; I plan to live with one of them when I leave here."  Objective: Pt seen and chart reviewed. Pt is alert/oriented x4, calm, cooperative, and appropriate to situation. Pt denies suicidal/homicidal ideation and psychosis and does not appear to be responding to internal stimuli. Pt states that she feels much more lucid and stable on her current medication regimen and denies any adverse effects. She reports an improvement in both sleep and appetite.    Principal Problem: Bipolar disorder, current episode manic, severe with psychotic features (HCC) Diagnosis:   Patient Active Problem List   Diagnosis Date Noted  . Bipolar disorder, current episode manic, severe with psychotic features (HCC) [F31.2] 06/01/2016    Priority: High   Total Time spent with patient: 25 minutes  Past Psychiatric History: MDD, substance abuse  Past Medical History:  Past Medical History  Diagnosis Date  . No pertinent past medical history   . Bipolar disorder [facility])     Past Surgical History  Procedure Laterality Date  . No past surgeries     Family History:  Family History  Problem Relation Age of Onset  . Other Neg Hx   . Cancer Paternal Uncle   . Diabetes Paternal Grandmother    Family Psychiatric  History: MDD Social History:  History  Alcohol Use  . Yes    Comment: 1 drink every month     History  Drug Use  . Yes  . Special: Marijuana    Social History   Social History  . Marital Status: Single    Spouse Name: N/A  . Number of Children: N/A  . Years of Education: N/A   Social History Main Topics  . Smoking status: Current Every Day Smoker    Types: Cigarettes  . Smokeless tobacco: Never Used  . Alcohol Use: Yes     Comment: 1 drink  every month  . Drug Use: Yes    Special: Marijuana  . Sexual Activity: Yes    Birth Control/ Protection: IUD   Other Topics Concern  . None   Social History Narrative   Additional Social History:                         Sleep: Fair  Appetite:  Fair  Current Medications: Current Facility-Administered Medications  Medication Dose Route Frequency Provider Last Rate Last Dose  . acetaminophen (TYLENOL) tablet 650 mg  650 mg Oral Q6H PRN Nelly Rout, MD      . alum & mag hydroxide-simeth (MAALOX/MYLANTA) 200-200-20 MG/5ML suspension 30 mL  30 mL Oral Q4H PRN Nelly Rout, MD      . benztropine (COGENTIN) tablet 0.5 mg  0.5 mg Oral BID Nelly Rout, MD   0.5 mg at 06/03/16 0836   Or  . benztropine mesylate (COGENTIN) injection 0.5 mg  0.5 mg Intramuscular BID Nelly Rout, MD      . carbamazepine (TEGRETOL) tablet 200 mg  200 mg Oral BID Nelly Rout, MD   200 mg at 06/03/16 0836  . haloperidol (HALDOL) tablet 5 mg  5 mg Oral BID Nelly Rout, MD   5 mg at 06/03/16 0836   Or  . haloperidol lactate (HALDOL) injection  5 mg  5 mg Intramuscular BID Nelly RoutArchana Kumar, MD      . magnesium hydroxide (MILK OF MAGNESIA) suspension 30 mL  30 mL Oral Daily PRN Nelly RoutArchana Kumar, MD      . traZODone (DESYREL) tablet 100 mg  100 mg Oral QHS Nelly RoutArchana Kumar, MD   100 mg at 06/01/16 2200    Lab Results:  Results for orders placed or performed during the hospital encounter of 06/01/16 (from the past 48 hour(s))  TSH     Status: None   Collection Time: 06/02/16  6:25 AM  Result Value Ref Range   TSH 2.382 0.350 - 4.500 uIU/mL    Comment: [facility] at [facility]  Lipid panel     Status: Abnormal   Collection Time: 06/02/16  6:25 AM  Result Value Ref Range   Cholesterol 123 0 - 200 mg/dL   Triglycerides 59 <409<150 mg/dL   HDL 34 (L) >81>40 mg/dL   Total CHOL/HDL Ratio 3.6 RATIO   VLDL 12 0 - 40 mg/dL   LDL Cholesterol 77 0 - 99 mg/dL    Comment:        Total  Cholesterol/HDL:CHD Risk Coronary Heart Disease Risk Table                     Men   Women  1/2 Average Risk   3.4   3.3  Average Risk       5.0   4.4  2 X Average Risk   9.6   7.1  3 X Average Risk  23.4   11.0        Use the calculated Patient Ratio above and the CHD Risk Table to determine the patient's CHD Risk.        ATP III CLASSIFICATION (LDL):  <100     mg/dL   Optimal  191-478100-129  mg/dL   Near or Above                    Optimal  130-159  mg/dL   Borderline  [id number]  mg/dL   High  >308>190     mg/dL   Very High [facility]   Hemoglobin A1c     Status: None   Collection Time: 06/02/16  6:25 AM  Result Value Ref Range   Hgb A1c MFr Bld 4.9 4.8 - 5.6 %    Comment: (NOTE)         Pre-diabetes: 5.7 - 6.4         Diabetes: >6.4         Glycemic control for adults with diabetes: <7.0    Mean Plasma Glucose 94 mg/dL    Comment: (NOTE) Performed At: [facility] [address] [place], [place] [id number] Mila HomerHancock William F MD [phone] Performed at [facility]     Blood Alcohol level:  Lab Results  Component Value Date   [facility]5 06/01/2016    Metabolic Disorder Labs: Lab Results  Component Value Date   HGBA1C 4.9 06/02/2016   MPG 94 06/02/2016   No results found for: PROLACTIN Lab Results  Component Value Date   CHOL 123 06/02/2016   TRIG 59 06/02/2016   HDL 34* 06/02/2016   CHOLHDL 3.6 06/02/2016   VLDL 12 06/02/2016   LDLCALC 77 06/02/2016    Physical Findings: AIMS: Facial and Oral Movements Muscles of Facial Expression: None, normal Lips and Perioral Area: None, normal Jaw: None, normal  Tongue: None, normal,Extremity Movements Upper (arms, wrists, hands, fingers): None, normal Lower (legs, knees, ankles, toes): None, normal, Trunk Movements Neck, shoulders, hips: None, normal, Overall Severity Severity of abnormal movements (highest score from questions above): None, normal Incapacitation due to  abnormal movements: None, normal Patient's awareness of abnormal movements (rate only patient's report): No Awareness, Dental Status Current problems with teeth and/or dentures?: No Does patient usually wear dentures?: No  CIWA:    COWS:      Musculoskeletal: Strength & Muscle Tone: within normal limits Gait & Station: normal Patient leans: N/A  Psychiatric Specialty Exam: Physical Exam  Review of Systems  Psychiatric/Behavioral: Positive for depression and substance abuse. Negative for suicidal ideas and hallucinations. The patient is nervous/anxious and has insomnia.   All other systems reviewed and are negative.   Blood pressure 113/78, pulse 102, temperature 99.2 F (37.3 C), temperature source Oral, resp. rate 16, height 5\' 1"  (1.549 m), weight 65.318 kg (144 lb), last menstrual period 06/01/2016.Body mass index is 27.22 kg/(m^2).  General Appearance: Casual and Fairly Groomed  Eye Contact:  Good  Speech:  Clear and Coherent and Normal Rate  Volume:  Normal  Mood:  Euthymic  Affect:  Appropriate, Congruent and Depressed  Thought Process:  Coherent, Linear and Descriptions of Associations: Intact  Orientation:  Full (Time, Place, and Person)  Thought Content:  Symptoms, worries, concerns  Suicidal Thoughts:  No  Homicidal Thoughts:  No  Memory:  Immediate;   Fair Recent;   Fair Remote;   Fair  Judgement:  Fair  Insight:  Fair  Psychomotor Activity:  Normal  Concentration:  Concentration: Fair and Attention Span: Fair  Recall:  [employer] of Knowledge:  Fair  Language:  Fair  Akathisia:  No  Handed:    AIMS (if indicated):     Assets:  Communication Skills Desire for Improvement Physical Health Resilience Social Support  ADL's:  Intact  Cognition:  WNL  Sleep:  Number of Hours: 6   Treatment Plan Summary: Bipolar disorder, current episode manic, severe with psychotic features (HCC), unstable, managed as below:  Medications: -continue cogentin 0.5mg  po bid  for EPS -continue haldol 5mg  po bid for psychosis -continue tegretol 200mg  po bid for mood stabilization -continue Trazodone 100mg  po qhs for insomnia  Beau Fanny, FNP 06/03/2016, 12:55 PM

## 2016-06-04 NOTE — [facility] Group Notes (Signed)
[facility] Group Notes:  (Nursing/MHT/Case Management/Adjunct)  Date:  06/04/2016  Time:  1:50 PM  Type of Therapy:  Nurse Education  Participation Level:  Active  Participation Quality:  Appropriate and Attentive  Affect:  Appropriate  Cognitive:  Alert and Appropriate  Insight:  Appropriate and Good  Engagement in Group:  Engaged  Modes of Intervention:  Discussion and Education    Summary of Progress/Problems: Topic was on leisure and lifestyle changes. Discussed the importance of choosing a healthy leisure activities. Group encouraged to surround themselves with positive and healthy group/support system when changing to a healthy lifestyle. Patient was receptive and contributed.    Mickie Baillizabeth O Iwenekha 06/04/2016, 1:50 PM

## 2016-06-04 NOTE — Progress Notes (Signed)
Pt was in the dayroom all evening until bedtime.  Writer spoke with patient who denies SI/HI/AVH.  Pt had minimal interaction with her peers as observed by staff.  She voiced no needs or concerns to Clinical research associatewriter.  Pt stated at bedtime that she has been sleeping ok the last few nights, and did not want the Trazodone for sleep.  Pt was encouraged to make her needs known to staff.  Conversation with pt was minimal.  Discharge plans are in process.  Safety maintained with q15 minute checks.

## 2016-06-04 NOTE — [facility] Suicide Risk Assessment (Signed)
[facility] INPATIENT:  Family/Significant Other Suicide Prevention Education  Suicide Prevention Education:  Education Completed; No one has been identified by the patient as the family member/significant other with whom the patient will be residing, and identified as the person(s) who will aid the patient in the event of a mental health crisis (suicidal ideations/suicide attempt).  With written consent from the patient, the family member/significant other has been provided the following suicide prevention education, prior to the and/or following the discharge of the patient.  The suicide prevention education provided includes the following:  Suicide risk factors  Suicide prevention and interventions  National Suicide Hotline telephone number  [facility] assessment telephone number  [facility] Emergency Assistance 911  [facility] and/or Residential Mobile Crisis Unit telephone number  Request made of family/significant other to:  Remove weapons (e.g., guns, rifles, knives), all items previously/currently identified as safety concern.    Remove drugs/medications (over-the-counter, prescriptions, illicit drugs), all items previously/currently identified as a safety concern.  The family member/significant other verbalizes understanding of the suicide prevention education information provided.  The family member/significant other agrees to remove the items of safety concern listed above. The patient did not endorse SI at the time of admission, nor did the patient c/o SI during the stay here.  SPE not required. However, I did talk to maternal grandmother, Rodolph BongLatonya Kenedy, [phone], with whom patient will be residing post d/c, about treatment plan and crises plan.  [name], Bruce Churilla B 06/04/2016, 4:17 PM

## 2016-06-04 NOTE — Progress Notes (Signed)
Patient attended karaoke group tonight.  
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
## 2016-06-04 NOTE — Progress Notes (Signed)
D-  Patient has been on the milieu, attending groups and engaged appropriately with peers.  Patient denies SI, HI , and AVH this shift.  Patient reported feeling like her medications have helped her and states that she is tired of being in situations like this and hopes to find a way to stay compliant with medications.  Patient states her goal is to stay focus on her treatment this shift.   A- Assess patient for safety, offer medications as prescribed, engage patient in 1:1 staff talks.   R- Continue to monitor for safety, patient able to contract for safety this shift.

## 2016-06-04 NOTE — Progress Notes (Signed)
[facility] MD Progress Note  06/04/2016 1:17 PM Grace Morris  MRN:  [id number] Subjective: [place] still [place] in [place] have to be in court there by August 8. I feel my thinking is better, the medications are helping some  Objective: Patient alert and oriented to place and person not time. Patient reports that she slept some last night, still feels caught is going to heal her. She seems calmer this morning, not loud but continues to have delusions, racing thoughts. She contracts for safety on the unit and denies any thoughts of harm to others. She however continues to be delusional and hypomanic, requiring redirection.   Principal Problem: Bipolar disorder, current episode manic, severe with psychotic features (HCC) Diagnosis:   Patient Active Problem List   Diagnosis Date Noted  . Bipolar disorder, current episode manic, severe with psychotic features (HCC) [F31.2] 06/01/2016    Priority: High   Total Time spent with patient: 20 minutes  Past Psychiatric History: MDD, substance abuse  Past Medical History:  Past Medical History  Diagnosis Date  . No pertinent past medical history   . Bipolar disorder [facility](HCC)     Past Surgical History  Procedure Laterality Date  . No past surgeries     Family History:  Family History  Problem Relation Age of Onset  . Other Neg Hx   . Cancer Paternal Uncle   . Diabetes Paternal Grandmother    Family Psychiatric  History: MDD Social History:  History  Alcohol Use  . Yes    Comment: 1 drink every month     History  Drug Use  . Yes  . Special: Marijuana    Social History   Social History  . Marital Status: Single    Spouse Name: N/A  . Number of Children: N/A  . Years of Education: N/A   Social History Main Topics  . Smoking status: Current Every Day Smoker    Types: Cigarettes  . Smokeless tobacco: Never Used  . Alcohol Use: Yes     Comment: 1 drink every month  . Drug Use: Yes    Special: Marijuana  . Sexual Activity: Yes     Birth Control/ Protection: IUD   Other Topics Concern  . None   Social History Narrative   Additional Social History:                         Sleep: Fair  Appetite:  Fair  Current Medications: Current Facility-Administered Medications  Medication Dose Route Frequency Provider Last Rate Last Dose  . acetaminophen (TYLENOL) tablet 650 mg  650 mg Oral Q6H PRN Nelly RoutArchana Laurian Edrington, MD      . alum & mag hydroxide-simeth (MAALOX/MYLANTA) 200-200-20 MG/5ML suspension 30 mL  30 mL Oral Q4H PRN Nelly RoutArchana Delana Manganello, MD      . benztropine (COGENTIN) tablet 0.5 mg  0.5 mg Oral BID Nelly RoutArchana Linah Klapper, MD   0.5 mg at 06/04/16 [id number]   Or  . benztropine mesylate (COGENTIN) injection 0.5 mg  0.5 mg Intramuscular BID Nelly RoutArchana James Lafalce, MD      . carbamazepine (TEGRETOL) tablet 200 mg  200 mg Oral BID Nelly RoutArchana Maurilio Puryear, MD   200 mg at 06/04/16 [id number]  . haloperidol (HALDOL) tablet 5 mg  5 mg Oral BID Nelly RoutArchana Areeb Corron, MD   5 mg at 06/04/16 [id number]   Or  . haloperidol lactate (HALDOL) injection 5 mg  5 mg Intramuscular BID Nelly RoutArchana Lesley Atkin, MD      .  magnesium hydroxide (MILK OF MAGNESIA) suspension 30 mL  30 mL Oral Daily PRN Nelly Rout, MD      . traZODone (DESYREL) tablet 100 mg  100 mg Oral QHS Nelly Rout, MD   100 mg at 06/01/16 2200    Lab Results:  No results found for this or any previous visit (from the past 48 hour(s)).  Blood Alcohol level:  Lab Results  Component Value Date   ETH <5 06/01/2016    Metabolic Disorder Labs: Lab Results  Component Value Date   HGBA1C 4.9 06/02/2016   MPG 94 06/02/2016   No results found for: PROLACTIN Lab Results  Component Value Date   CHOL 123 06/02/2016   TRIG 59 06/02/2016   HDL 34* 06/02/2016   CHOLHDL 3.6 06/02/2016   VLDL 12 06/02/2016   LDLCALC 77 06/02/2016    Physical Findings: AIMS: Facial and Oral Movements Muscles of Facial Expression: None, normal Lips and Perioral Area: None, normal Jaw: None, normal Tongue: None, normal,Extremity  Movements Upper (arms, wrists, hands, fingers): None, normal Lower (legs, knees, ankles, toes): None, normal, Trunk Movements Neck, shoulders, hips: None, normal, Overall Severity Severity of abnormal movements (highest score from questions above): None, normal Incapacitation due to abnormal movements: None, normal Patient's awareness of abnormal movements (rate only patient's report): No Awareness, Dental Status Current problems with teeth and/or dentures?: No Does patient usually wear dentures?: No  CIWA:    COWS:      Musculoskeletal: Strength & Muscle Tone: within normal limits Gait & Station: normal Patient leans: N/A  Psychiatric Specialty Exam: Physical Exam  Review of Systems  Constitutional: Negative.  Negative for fever and malaise/fatigue.  HENT: Negative.  Negative for congestion and sore throat.   Eyes: Negative.  Negative for blurred vision, double vision, discharge and redness.  Respiratory: Negative.  Negative for cough.   Cardiovascular: Negative.  Negative for chest pain and palpitations.  Gastrointestinal: Negative.  Negative for heartburn, nausea, vomiting, abdominal pain, diarrhea and constipation.  Genitourinary: Negative.  Negative for dysuria and urgency.  Musculoskeletal: Negative.  Negative for myalgias and falls.  Skin: Negative.  Negative for rash.  Neurological: Negative.  Negative for dizziness, focal weakness, loss of consciousness, weakness and headaches.  Endo/Heme/Allergies: Negative.  Negative for environmental allergies.  Psychiatric/Behavioral: Positive for depression and substance abuse. Negative for suicidal ideas and hallucinations. The patient is nervous/anxious and has insomnia.   All other systems reviewed and are negative.   Blood pressure 117/68, pulse 91, temperature 97.9 F (36.6 C), temperature source Oral, resp. rate 18, height 5\' 1"  (1.549 m), weight 65.318 kg (144 lb), last menstrual period 06/01/2016.Body mass index is 27.22  kg/(m^2).  General Appearance: Disheveled  Eye Contact:  Fair  Speech:  Clear and Coherent and Pressured  Volume:  Normal  Mood:  Euphoric and Irritable  Affect:  Appropriate, Congruent and Labile  Thought Process:  Coherent, Linear and Descriptions of Associations: Circumstantial  Orientation:  Full (Time, Place, and Person)  Thought Content:  Symptoms, worries, concerns  Suicidal Thoughts:  No  Homicidal Thoughts:  No  Memory:  Immediate;   Fair Recent;   Fair Remote;   Fair  Judgement:  Fair  Insight:  Fair  Psychomotor Activity:  Mannerisms  Concentration:  Concentration: Fair and Attention Span: Fair  Recall:  [employer] of Knowledge:  Fair  Language:  Fair  Akathisia:  No  Handed:    AIMS (if indicated):     Assets:  Communication Skills Desire  for Improvement Physical Health Resilience Social Support  ADL's:  Intact  Cognition:  WNL  Sleep:  Number of Hours: 6.75   Treatment Plan Summary: Bipolar disorder, current episode manic, severe with psychotic features (HCC), unstable, managed as below:  Medications: -continue cogentin 0.5mg  po bid for EPS -continue haldol  po bid for psychosis,Will increase to 5 mg in the morning and 10 at bedtime from tomorrow night as patient seems to be tolerating the medication well. -continue tegretol  po bid for mood stabilization -continue Trazodone  po qhs for insomnia -Continue to participate in therapeutic milieu and groups -Social worker to obtain collateral information from mom to help and discharge planning Beau Fanny, FNP 06/04/2016, 1:17 PM

## 2016-06-04 NOTE — [facility] Group Notes (Signed)
[facility] LCSW Group Therapy  06/04/2016 , 4:08 PM   Type of Therapy:  Group Therapy  Participation Level:  Active  Participation Quality:  Attentive  Affect:  Appropriate  Cognitive:  Alert  Insight:  Improving  Engagement in Therapy:  Engaged  Modes of Intervention:  Discussion, Exploration and Socialization  Summary of Progress/Problems: Today's group focused on the term Diagnosis.  Participants were asked to define the term, and then pronounce whether it is a negative, positive or neutral term.  Stayed the entire time, engaged throughout. Talked about how it is important to follow one's treatment plan, including taking medications, when one has a mental health diagnosis. This flies in the face of evidence that indicates she does not do this when she is on her own outside of the hospital.  But she does speak with insight about her symptoms and how they get in the way of her day to day functioning.  Grace Morris, Grace Morris 06/04/2016 , 4:08 PM

## 2016-06-05 MED ORDER — HALOPERIDOL DECANOATE 100 MG/ML IM SOLN
50.0000 mg | Freq: Once | INTRAMUSCULAR | Status: AC
  Administered 2016-06-05: 50 mg via INTRAMUSCULAR
  Filled 2016-06-05: qty 0.5

## 2016-06-05 MED ORDER — HALOPERIDOL 5 MG PO TABS
5.0000 mg | ORAL_TABLET | Freq: Every day | ORAL | Status: DC
  Administered 2016-06-05 – 2016-06-08 (×4): 5 mg via ORAL
  Filled 2016-06-05: qty 1
  Filled 2016-06-05: qty 7
  Filled 2016-06-05: qty 1
  Filled 2016-06-05: qty 7
  Filled 2016-06-05 (×3): qty 1

## 2016-06-05 MED ORDER — HALOPERIDOL 5 MG PO TABS
10.0000 mg | ORAL_TABLET | Freq: Every day | ORAL | Status: DC
  Filled 2016-06-05 (×3): qty 2

## 2016-06-05 NOTE — Progress Notes (Signed)
[facility] MD Progress Note  06/05/2016 3:43 PM Grace Morris  MRN:  [id number]   Subjective:  Patient is in dayroom.  She is smiling.  Discussed medication changes with patient.   Objective:  Grace Morris is 25 yo came in with bizarre behaviors and AH.  There is slight improvement in her as she is interacting appropriately with peers.  Noted improvement in mood.  She received her first Haldol Dec IM without event.  Grace Morris is future oriented towards discharge.  Principal Problem: Bipolar disorder, current episode manic, severe with psychotic features (HCC) Diagnosis:   Patient Active Problem List   Diagnosis Date Noted  . Bipolar disorder, current episode manic, severe with psychotic features (HCC) [F31.2] 06/01/2016   Total Time spent with patient: 20 minutes  Past Psychiatric History: MDD, substance abuse  Past Medical History:  Past Medical History  Diagnosis Date  . No pertinent past medical history   . Bipolar disorder [facility])     Past Surgical History  Procedure Laterality Date  . No past surgeries     Family History:  Family History  Problem Relation Age of Onset  . Other Neg Hx   . Cancer Paternal Uncle   . Diabetes Paternal Grandmother    Family Psychiatric  History: MDD Social History:  History  Alcohol Use  . Yes    Comment: 1 drink every month     History  Drug Use  . Yes  . Special: Marijuana    Social History   Social History  . Marital Status: Single    Spouse Name: N/A  . Number of Children: N/A  . Years of Education: N/A   Social History Main Topics  . Smoking status: Current Every Day Smoker    Types: Cigarettes  . Smokeless tobacco: Never Used  . Alcohol Use: Yes     Comment: 1 drink every month  . Drug Use: Yes    Special: Marijuana  . Sexual Activity: Yes    Birth Control/ Protection: IUD   Other Topics Concern  . None   Social History Narrative   Additional Social History:     Sleep: Fair  Appetite:  Fair  Current  Medications: Current Facility-Administered Medications  Medication Dose Route Frequency Provider Last Rate Last Dose  . acetaminophen (TYLENOL) tablet 650 mg  650 mg Oral Q6H PRN Nelly Rout, MD      . alum & mag hydroxide-simeth (MAALOX/MYLANTA) 200-200-20 MG/5ML suspension 30 mL  30 mL Oral Q4H PRN Nelly Rout, MD      . benztropine (COGENTIN) tablet 0.5 mg  0.5 mg Oral BID Nelly Rout, MD   0.5 mg at 06/05/16 4782   Or  . benztropine mesylate (COGENTIN) injection 0.5 mg  0.5 mg Intramuscular BID Nelly Rout, MD      . carbamazepine (TEGRETOL) tablet 200 mg  200 mg Oral BID Nelly Rout, MD   200 mg at 06/05/16 0816  . haloperidol (HALDOL) tablet 5 mg  5 mg Oral QPC breakfast Nelly Rout, MD   5 mg at 06/05/16 0817  . magnesium hydroxide (MILK OF MAGNESIA) suspension 30 mL  30 mL Oral Daily PRN Nelly Rout, MD      . traZODone (DESYREL) tablet 100 mg  100 mg Oral QHS Nelly Rout, MD   100 mg at 06/01/16 2200    Lab Results:  No results found for this or any previous visit (from the past 48 hour(s)).  Blood Alcohol level:  Lab Results  Component  Value Date   ETH <5 06/01/2016    Metabolic Disorder Labs: Lab Results  Component Value Date   HGBA1C 4.9 06/02/2016   MPG 94 06/02/2016   No results found for: PROLACTIN Lab Results  Component Value Date   CHOL 123 06/02/2016   TRIG 59 06/02/2016   HDL 34* 06/02/2016   CHOLHDL 3.6 06/02/2016   VLDL 12 06/02/2016   LDLCALC 77 06/02/2016    Physical Findings: AIMS: Facial and Oral Movements Muscles of Facial Expression: None, normal Lips and Perioral Area: None, normal Jaw: None, normal Tongue: None, normal,Extremity Movements Upper (arms, wrists, hands, fingers): None, normal Lower (legs, knees, ankles, toes): None, normal, Trunk Movements Neck, shoulders, hips: None, normal, Overall Severity Severity of abnormal movements (highest score from questions above): None, normal Incapacitation due to abnormal  movements: None, normal Patient's awareness of abnormal movements (rate only patient's report): No Awareness, Dental Status Current problems with teeth and/or dentures?: No Does patient usually wear dentures?: No  CIWA:    COWS:      Musculoskeletal: Strength & Muscle Tone: within normal limits Gait & Station: normal Patient leans: N/A  Psychiatric Specialty Exam: Physical Exam  Nursing note and vitals reviewed. Psychiatric: Her mood appears anxious. Her affect is labile. Thought content is not paranoid. She exhibits a depressed mood. She expresses no homicidal and no suicidal ideation.    Review of Systems  Constitutional: Negative.  Negative for fever and malaise/fatigue.  HENT: Negative.  Negative for congestion and sore throat.   Eyes: Negative.  Negative for blurred vision, double vision, discharge and redness.  Respiratory: Negative.  Negative for cough.   Cardiovascular: Negative.  Negative for chest pain and palpitations.  Gastrointestinal: Negative.  Negative for heartburn, nausea, vomiting, abdominal pain, diarrhea and constipation.  Genitourinary: Negative.  Negative for dysuria and urgency.  Musculoskeletal: Negative.  Negative for myalgias and falls.  Skin: Negative.  Negative for rash.  Neurological: Negative.  Negative for dizziness, focal weakness, loss of consciousness, weakness and headaches.  Endo/Heme/Allergies: Negative.  Negative for environmental allergies.  Psychiatric/Behavioral: Positive for depression and substance abuse. Negative for suicidal ideas and hallucinations. The patient is nervous/anxious and has insomnia.   All other systems reviewed and are negative.   Blood pressure 98/75, pulse 85, temperature 98.4 F (36.9 C), temperature source Oral, resp. rate 18, height 5\' 1"  (1.549 m), weight 65.318 kg (144 lb), last menstrual period 06/01/2016.Body mass index is 27.22 kg/(m^2).  General Appearance: Disheveled  Eye Contact:  Fair  Speech:  Clear and  Coherent and Pressured  Volume:  Normal  Mood:  Euphoric and Irritable  Affect:  Appropriate, Congruent and Labile  Thought Process:  Coherent, Linear and Descriptions of Associations: Circumstantial  Orientation:  Full (Time, Place, and Person)  Thought Content:  Symptoms, worries, concerns  Suicidal Thoughts:  No  Homicidal Thoughts:  No  Memory:  Immediate;   Fair Recent;   Fair Remote;   Fair  Judgement:  Fair  Insight:  Fair  Psychomotor Activity:  Mannerisms  Concentration:  Concentration: Fair and Attention Span: Fair  Recall:  [employer] of Knowledge:  Fair  Language:  Fair  Akathisia:  No  Handed:    AIMS (if indicated):     Assets:  Communication Skills Desire for Improvement Physical Health Resilience Social Support  ADL's:  Intact  Cognition:  WNL  Sleep:  Number of Hours: 6   Treatment Plan Summary: Bipolar disorder, current episode manic, severe with psychotic features (HCC),  unstable, managed as below:  Medications: -continue Cogentin 0.5mg  po bid for EPS -continue Haldol 5mg  po bid for psychosis, DC Haldol 10 mg QHS dose.  Start Haldol Dec 50 mg once.  EKG WNL -continue Tegretol 200mg  po bid for mood stabilization -continue Trazodone 100mg  po qhs for insomnia -Labs reviewed:  HgB A1C 4.9, lipid panel WNL -Continue to participate in therapeutic milieu and groups -Social worker to obtain collateral information from mom to help and discharge planning  Lindwood QuaSheila May Mahagony Grieb, NP [facility] 06/05/2016, 3:43 PM

## 2016-06-05 NOTE — [facility] Group Notes (Signed)
[facility] LCSW Group Therapy  06/05/2016  1:05 PM  Type of Therapy:  Group therapy  Participation Level:  Active  Participation Quality:  Attentive  Affect:  Flat  Cognitive:  Oriented  Insight:  Limited  Engagement in Therapy:  Limited  Modes of Intervention:  Discussion, Socialization  Summary of Progress/Problems:  Chaplain was here to lead a group on themes of hope and courage.  Invited.  Chose to not attend. [name], Braydee Shimkus B 06/05/2016 1:24 PM

## 2016-06-05 NOTE — Progress Notes (Signed)
Pt has been observed in the dayroom most of the evening talking with a female peer.  She reports that her day has been good and that she anticipates being discharged soon.  She denies SI/HI/AVH.  Pt did not offer any information on her discharge plans other than she wanted to be discharged soon.  She has been polite and cooperative.  She attended evening group.  Support and encouragement offered.  Discharge plans are in process.  Safety maintained with q15 minute checks.

## 2016-06-05 NOTE — Progress Notes (Signed)
Adult Psychoeducational Group Note  Date:  06/05/2016 Time:  9:01 PM  Group Topic/Focus:  Wrap-Up Group:   The focus of this group is to help patients review their daily goal of treatment and discuss progress on daily workbooks.  Participation Level:  Active  Participation Quality:  Appropriate  Affect:  Appropriate  Cognitive:  Appropriate  Insight: Appropriate  Engagement in Group:  Engaged  Modes of Intervention:  Discussion  Additional Comments: The patient expressed that she attended groups.The patient also said that she had a wonderful day.  Octavio Mannshigpen, Phuoc Huy Lee 06/05/2016, 9:01 PM

## 2016-06-05 NOTE — Progress Notes (Signed)
Pt was in the dayroom interacting with peers at the beginning of the shift.  She reports that she is doing fine, and voiced no needs or concerns.  She was quietly singing a song and dancing.  Writer jokingly said that she must be practicing for [employer], and patient laughed.  She denies SI/HI/AVH.  She tells Clinical research associatewriter that she wants to stay on her meds when she is discharged and hopes that will be soon.  She says she sleeps fine and still does not want the trazodone for sleep.  Support and encouragement offered.  Discharge plans are in process.  Safety maintained with q15 minute checks.

## 2016-06-05 NOTE — Progress Notes (Signed)
D: Patient denies SI/HI and A/V hallucinations; patient reports sleep to be; reports appetite to be ; reports energy level is ; reports ability to pay attention to; rates depression as 0/10; rates hopelessness 0/10; rates anxiety as 0/10;   A: Monitored q 15 minutes; patient encouraged to attend groups; patient educated about medications; patient given medications per physician orders; patient encouraged to express feelings and/or concerns  R: Patient is cooperative and pleasant; patient is focusing on being positive; patient is engaging and appears to be invested; patient's interaction with staff and peers is appropriate; patient was able to set goal to talk with staff 1:1 when having feelings of SI; patient is taking medications as prescribed and tolerating medications; patient is attending all groups

## 2016-06-05 NOTE — Tx Team (Signed)
Interdisciplinary Treatment Plan Update (Adult) Date: 06/05/2016   Date: 06/05/2016 4:44 PM  Progress in Treatment:  Attending groups: Yes Participating in groups:Yes  Taking medication as prescribed: Yes  Tolerating medication: Yes  Family/Significant othe contact made: Yes Patient understands diagnosis: Yes AEB asking to be restarted on her meds that work Discussing patient identified problems/goals with staff: Yes  Medical problems stabilized or resolved: Yes  Denies suicidal/homicidal ideation: Yes Patient has not harmed self or Others: Yes   New problem(s) identified: None identified at this time.   Discharge Plan or Barriers: Pt will return home and follow-up with outpatient services.   Additional comments: Grace Morris is an 25 y.o.single female who was brought into the [facility] tonight involuntarily by the [employer] due to bizarre behavior she displayed at her mother's home where she was out on the front lawn "yelling and being disorderly." Mom petitioned for the IVC. IVC paperwork sts that Pt has been diagnosed with Paranoid Schizophrenia and was IVC'd in 2015 and 2016. Paperwork sts that pt hears voices and the voices tell her "to do bad things."  Haldol, Tegretol trial with Haldol IM ordered if pt refuses   06/05/16: -continue Cogentin 0.64m po bid for EPS -continue Haldol 548mpo bid for psychosis, DC Haldol 10 mg QHS dose. Start Haldol Dec 50 mg once. EKG WNL -continue Tegretol 20071mo bid for mood stabilization -continue Trazodone 100m86m qhs for insomnia   Reason for Continuation of Hospitalization:  Anxiety Medication stabilization Hallucinations  Estimated length of stay: D/C Monday  Review of initial/current patient goals per problem list:   1.  Goal(s): Patient will participate in aftercare plan  Met:  Yes  Target date: 3-5 days from date of admission   As evidenced by: Patient will participate within aftercare plan AEB aftercare provider and housing plan at  discharge being identified.   06/02/16: Pt will return home and follow-up with outpatient services.   6. Goal (s):  Patient will demonstrate decreased signs of mania . Met: Yes . Target date: 3-5 days from date of admission  . As evidenced by:  Patient demonstrate decreased signs of mania AEB decreased mood instability and demonstration of stable mood    06/02/16: Pt admitted with elevated mood, hyper-religiosity, and appears to be responding to internal stimuli   06/05/16:  No signs nor symptoms of mood instability today  Attendees:  Patient:    Family:    Physician: Dr. KumaDwyane Dee  06/05/2016 4:44 PM  Nursing:  ElizLarrie KassN 06/05/2016 4:44 PM  Clinical Social Worker [place]/2017 4:44 PM  Other: 06/05/2016 4:44 PM  Clinical: JennLars Pinks Case manager  06/05/2016 4:44 PM  Other:  06/05/2016 4:44 PM  Other:     Rod Ripley Fraise

## 2016-06-05 NOTE — Progress Notes (Deleted)
[facility]  06/05/2016 3:15 PM Grace Morris  MRN:  [id number]   Subjective:  Patient stated she was doing ok, and had no problems.  She is inquiring when she will get to go home.  "I want to get on right meds and be able to go home."  Objective:  Grace Morris is 25 yo came in with bizarre behaviors and AH.  There is slight improvement in her as she is interacting appropriately with peers.  She does require some redirection at times.   Principal Problem: Bipolar disorder, current episode manic, severe with psychotic features (HCC) Diagnosis:   Patient Active Problem List   Diagnosis Date Noted  . Bipolar disorder, current episode manic, severe with psychotic features (HCC) [F31.2] 06/01/2016   Total Time spent with patient: 20 minutes  Past Psychiatric History: MDD, substance abuse  Past Medical History:  Past Medical History  Diagnosis Date  . No pertinent past medical history   . Bipolar disorder [facility](HCC)     Past Surgical History  Procedure Laterality Date  . No past surgeries     Family History:  Family History  Problem Relation Age of Onset  . Other Neg Hx   . Cancer Paternal Uncle   . Diabetes Paternal Grandmother    Family Psychiatric  History: MDD Social History:  History  Alcohol Use  . Yes    Comment: 1 drink every month     History  Drug Use  . Yes  . Special: Marijuana    Social History   Social History  . Marital Status: Single    Spouse Name: N/A  . Number of Children: N/A  . Years of Education: N/A   Social History Main Topics  . Smoking status: Current Every Day Smoker    Types: Cigarettes  . Smokeless tobacco: Never Used  . Alcohol Use: Yes     Comment: 1 drink every month  . Drug Use: Yes    Special: Marijuana  . Sexual Activity: Yes    Birth Control/ Protection: IUD   Other Topics Concern  . None   Social History Narrative   Additional Social History:                         Sleep: Fair  Appetite:   Fair  Current Medications: Current Facility-Administered Medications  Medication Dose Route Frequency Provider Last Rate Last Dose  . acetaminophen (TYLENOL) tablet 650 mg  650 mg Oral Q6H PRN Nelly RoutArchana Kumar, MD      . alum & mag hydroxide-simeth (MAALOX/MYLANTA) 200-200-20 MG/5ML suspension 30 mL  30 mL Oral Q4H PRN Nelly RoutArchana Kumar, MD      . benztropine (COGENTIN) tablet 0.5 mg  0.5 mg Oral BID Nelly RoutArchana Kumar, MD   0.5 mg at 06/05/16 [id number]   Or  . benztropine mesylate (COGENTIN) injection 0.5 mg  0.5 mg Intramuscular BID Nelly RoutArchana Kumar, MD      . carbamazepine (TEGRETOL) tablet 200 mg  200 mg Oral BID Nelly RoutArchana Kumar, MD   200 mg at 06/05/16 0816  . haloperidol (HALDOL) tablet 5 mg  5 mg Oral QPC breakfast Nelly RoutArchana Kumar, MD   5 mg at 06/05/16 [id number]  . haloperidol decanoate (HALDOL DECANOATE) 100 MG/ML injection 50 mg  50 mg Intramuscular Once Nelly RoutArchana Kumar, MD      . magnesium hydroxide (MILK OF MAGNESIA) suspension 30 mL  30 mL Oral Daily PRN Nelly RoutArchana Kumar, MD      .  traZODone (DESYREL) tablet 100 mg  100 mg Oral QHS Nelly Rout, MD   100 mg at 06/01/16 2200    Lab Results:  No results found for this or any previous visit (from the past 48 hour(s)).  Blood Alcohol level:  Lab Results  Component Value Date   ETH <5 06/01/2016    Metabolic Disorder Labs: Lab Results  Component Value Date   HGBA1C 4.9 06/02/2016   MPG 94 06/02/2016   No results found for: PROLACTIN Lab Results  Component Value Date   CHOL 123 06/02/2016   TRIG 59 06/02/2016   HDL 34* 06/02/2016   CHOLHDL 3.6 06/02/2016   VLDL 12 06/02/2016   LDLCALC 77 06/02/2016    Physical Findings: AIMS: Facial and Oral Movements Muscles of Facial Expression: None, normal Lips and Perioral Area: None, normal Jaw: None, normal Tongue: None, normal,Extremity Movements Upper (arms, wrists, hands, fingers): None, normal Lower (legs, knees, ankles, toes): None, normal, Trunk Movements Neck, shoulders, hips: None, normal,  Overall Severity Severity of abnormal movements (highest score from questions above): None, normal Incapacitation due to abnormal movements: None, normal Patient's awareness of abnormal movements (rate only patient's report): No Awareness, Dental Status Current problems with teeth and/or dentures?: No Does patient usually wear dentures?: No  CIWA:    COWS:      Musculoskeletal: Strength & Muscle Tone: within normal limits Gait & Station: normal Patient leans: N/A  Psychiatric Specialty Exam: Physical Exam  Vitals reviewed. Psychiatric: Her mood appears anxious. Her affect is labile. She exhibits a depressed mood.    Review of Systems  Constitutional: Negative.  Negative for fever and malaise/fatigue.  HENT: Negative.  Negative for congestion and sore throat.   Eyes: Negative.  Negative for blurred vision, double vision, discharge and redness.  Respiratory: Negative.  Negative for cough.   Cardiovascular: Negative.  Negative for chest pain and palpitations.  Gastrointestinal: Negative.  Negative for heartburn, nausea, vomiting, abdominal pain, diarrhea and constipation.  Genitourinary: Negative.  Negative for dysuria and urgency.  Musculoskeletal: Negative.  Negative for myalgias and falls.  Skin: Negative.  Negative for rash.  Neurological: Negative.  Negative for dizziness, focal weakness, loss of consciousness, weakness and headaches.  Endo/Heme/Allergies: Negative.  Negative for environmental allergies.  Psychiatric/Behavioral: Positive for depression and substance abuse. Negative for suicidal ideas and hallucinations. The patient is nervous/anxious and has insomnia.   All other systems reviewed and are negative.   Blood pressure 98/75, pulse 85, temperature 98.4 F (36.9 C), temperature source Oral, resp. rate 18, height 5\' 1"  (1.549 m), weight 65.318 kg (144 lb), last menstrual period 06/01/2016.Body mass index is 27.22 kg/(m^2).  General Appearance: Disheveled  Eye Contact:   Fair  Speech:  Clear and Coherent and Pressured  Volume:  Normal  Mood:  Euphoric and Irritable  Affect:  Appropriate, Congruent and Labile  Thought Process:  Coherent, Linear and Descriptions of Associations: Circumstantial  Orientation:  Full (Time, Place, and Person)  Thought Content:  Symptoms, worries, concerns  Suicidal Thoughts:  No  Homicidal Thoughts:  No  Memory:  Immediate;   Fair Recent;   Fair Remote;   Fair  Judgement:  Fair  Insight:  Fair  Psychomotor Activity:  Mannerisms  Concentration:  Concentration: Fair and Attention Span: Fair  Recall:  [employer] of Knowledge:  Fair  Language:  Fair  Akathisia:  No  Handed:    AIMS (if indicated):     Assets:  Communication Skills Desire for Improvement Physical Health  Resilience Social Support  ADL's:  Intact  Cognition:  WNL  Sleep:  Number of Hours: 6   Treatment Plan Summary: Bipolar disorder, current episode manic, severe with psychotic features (HCC), unstable, managed as below:  Medications: -continue Cogentin 0.5mg  po bid for EPS -continue Haldol  po bid for psychosis, DC Haldol 10 mg QHS dose.  Start Haldol Dec 50 mg once.  EKG WNL -continue Tegretol  po bid for mood stabilization -continue Trazodone  po qhs for insomnia -Labs reviewed:  HgB A1C 4.9, lipid panel WNL -Continue to participate in therapeutic milieu and groups -Social worker to obtain collateral information from mom to help and discharge planning  Lindwood Qua, NP [facility] 06/05/2016, 3:15 PM

## 2016-06-06 MED ORDER — TRAZODONE HCL 50 MG PO TABS
50.0000 mg | ORAL_TABLET | Freq: Every day | ORAL | Status: DC
  Filled 2016-06-06: qty 7
  Filled 2016-06-06: qty 1
  Filled 2016-06-06: qty 7
  Filled 2016-06-06 (×3): qty 1

## 2016-06-06 NOTE — Progress Notes (Signed)
D.  Pt pleasant on approach, denies complaints at this time.  Pt states she is fearful to take Trazodone 100 mg for sleep because she is afraid she will have difficulty waking up.  Pt states she has taken it before but in a smaller dose.  Pt did attend evening wrap up group with appropriate participation.  Pt was observed in dayroom interacting appropriately with peers on the unit.  Pt denies SI/HI/hallucinations at this time.  A.  Support and encouragement offered, PA called and Trazodone order cut to 50 mg as requested.  R.  Pt pleased by new order but would still like to try to fall asleep on her own.  She states she will request the Trazodone if she finds that she can not fall asleep.  Pt remains safe on the unit, will continue to monitor.

## 2016-06-06 NOTE — Progress Notes (Signed)
[facility] MD Progress Note  06/06/2016 11:58 AM Grace Morris  MRN:  [id number]   Subjective:  Patient doing well.  More expressive in affect.  She is seen attending groups.    Objective:  Grace Morris is 25 yo came in with bizarre behaviors and AH.  Improved and she is interacting appropriately with peers.  Tolerated Haldol Dec IM dose.  Noted improvement in mood.  Grace Morris is future oriented towards discharge.    Principal Problem: Bipolar disorder, current episode manic, severe with psychotic features (HCC) Diagnosis:   Patient Active Problem List   Diagnosis Date Noted  . Bipolar disorder, current episode manic, severe with psychotic features (HCC) [F31.2] 06/01/2016   Total Time spent with patient: 20 minutes  Past Psychiatric History: MDD, substance abuse  Past Medical History:  Past Medical History  Diagnosis Date  . No pertinent past medical history   . Bipolar disorder [facility])     Past Surgical History  Procedure Laterality Date  . No past surgeries     Family History:  Family History  Problem Relation Age of Onset  . Other Neg Hx   . Cancer Paternal Uncle   . Diabetes Paternal Grandmother    Family Psychiatric  History: MDD Social History:  History  Alcohol Use  . Yes    Comment: 1 drink every month     History  Drug Use  . Yes  . Special: Marijuana    Social History   Social History  . Marital Status: Single    Spouse Name: N/A  . Number of Children: N/A  . Years of Education: N/A   Social History Main Topics  . Smoking status: Current Every Day Smoker    Types: Cigarettes  . Smokeless tobacco: Never Used  . Alcohol Use: Yes     Comment: 1 drink every month  . Drug Use: Yes    Special: Marijuana  . Sexual Activity: Yes    Birth Control/ Protection: IUD   Other Topics Concern  . None   Social History Narrative   Additional Social History:     Sleep: Fair  Appetite:  Fair  Current Medications: Current Facility-Administered Medications   Medication Dose Route Frequency Provider Last Rate Last Dose  . acetaminophen (TYLENOL) tablet 650 mg  650 mg Oral Q6H PRN Nelly Rout, MD      . alum & mag hydroxide-simeth (MAALOX/MYLANTA) 200-200-20 MG/5ML suspension 30 mL  30 mL Oral Q4H PRN Nelly Rout, MD      . benztropine (COGENTIN) tablet 0.5 mg  0.5 mg Oral BID Nelly Rout, MD   0.5 mg at 06/06/16 2355   Or  . benztropine mesylate (COGENTIN) injection 0.5 mg  0.5 mg Intramuscular BID Nelly Rout, MD      . carbamazepine (TEGRETOL) tablet 200 mg  200 mg Oral BID Nelly Rout, MD   200 mg at 06/06/16 0835  . haloperidol (HALDOL) tablet 5 mg  5 mg Oral QPC breakfast Nelly Rout, MD   5 mg at 06/06/16 0835  . magnesium hydroxide (MILK OF MAGNESIA) suspension 30 mL  30 mL Oral Daily PRN Nelly Rout, MD      . traZODone (DESYREL) tablet 100 mg  100 mg Oral QHS Nelly Rout, MD   100 mg at 06/01/16 2200    Lab Results:  No results found for this or any previous visit (from the past 48 hour(s)).  Blood Alcohol level:  Lab Results  Component Value Date   ETH <5  06/01/2016    Metabolic Disorder Labs: Lab Results  Component Value Date   HGBA1C 4.9 06/02/2016   MPG 94 06/02/2016   No results found for: PROLACTIN Lab Results  Component Value Date   CHOL 123 06/02/2016   TRIG 59 06/02/2016   HDL 34* 06/02/2016   CHOLHDL 3.6 06/02/2016   VLDL 12 06/02/2016   LDLCALC 77 06/02/2016    Physical Findings: AIMS: Facial and Oral Movements Muscles of Facial Expression: None, normal Lips and Perioral Area: None, normal Jaw: None, normal Tongue: None, normal,Extremity Movements Upper (arms, wrists, hands, fingers): None, normal Lower (legs, knees, ankles, toes): None, normal, Trunk Movements Neck, shoulders, hips: None, normal, Overall Severity Severity of abnormal movements (highest score from questions above): None, normal Incapacitation due to abnormal movements: None, normal Patient's awareness of abnormal  movements (rate only patient's report): No Awareness, Dental Status Current problems with teeth and/or dentures?: No Does patient usually wear dentures?: No  CIWA:    COWS:      Musculoskeletal: Strength & Muscle Tone: within normal limits Gait & Station: normal Patient leans: N/A  Psychiatric Specialty Exam: Physical Exam  Nursing note and vitals reviewed. Psychiatric: Her mood appears anxious. Her affect is labile. Thought content is not paranoid. She exhibits a depressed mood. She expresses no homicidal and no suicidal ideation.    Review of Systems  Constitutional: Negative.  Negative for fever and malaise/fatigue.  HENT: Negative.  Negative for congestion and sore throat.   Eyes: Negative.  Negative for blurred vision, double vision, discharge and redness.  Respiratory: Negative.  Negative for cough.   Cardiovascular: Negative.  Negative for chest pain and palpitations.  Gastrointestinal: Negative.  Negative for heartburn, nausea, vomiting, abdominal pain, diarrhea and constipation.  Genitourinary: Negative.  Negative for dysuria and urgency.  Musculoskeletal: Negative.  Negative for myalgias and falls.  Skin: Negative.  Negative for rash.  Neurological: Negative.  Negative for dizziness, focal weakness, loss of consciousness, weakness and headaches.  Endo/Heme/Allergies: Negative.  Negative for environmental allergies.  Psychiatric/Behavioral: Positive for depression and substance abuse. Negative for suicidal ideas and hallucinations. The patient is nervous/anxious and has insomnia.   All other systems reviewed and are negative.   Blood pressure 103/63, pulse 63, temperature 97.6 F (36.4 C), temperature source Oral, resp. rate 20, height  (1.549 m), weight 65.318 kg (144 lb), last menstrual period 06/01/2016.Body mass index is 27.22 kg/(m^2).  General Appearance: Disheveled  Eye Contact:  Fair  Speech:  Clear and Coherent and Pressured  Volume:  Normal  Mood:   Euphoric and Irritable  Affect:  Appropriate, Congruent and Labile  Thought Process:  Coherent, Linear and Descriptions of Associations: Circumstantial  Orientation:  Full (Time, Place, and Person)  Thought Content:  Symptoms, worries, concerns  Suicidal Thoughts:  No  Homicidal Thoughts:  No  Memory:  Immediate;   Fair Recent;   Fair Remote;   Fair  Judgement:  Fair  Insight:  Fair  Psychomotor Activity:  Mannerisms  Concentration:  Concentration: Fair and Attention Span: Fair  Recall:  [employer] of Knowledge:  Fair  Language:  Fair  Akathisia:  No  Handed:    AIMS (if indicated):     Assets:  Communication Skills Desire for Improvement Physical Health Resilience Social Support  ADL's:  Intact  Cognition:  WNL  Sleep:  Number of Hours: 6.75   Treatment Plan Summary: Bipolar disorder, current episode manic, severe with psychotic features (HCC), unstable, managed as below:  Medications: -  continue Cogentin 0.5mg  po bid for EPS -continue Haldol 5mg  po bid for psychosis, DC Haldol 10 mg QHS dose.  First dose Haldol Dec 50 mg 7/21.  Tolerated well.  EKG WNL -continue Tegretol 200mg  po bid for mood stabilization -continue Trazodone 100mg  po qhs for insomnia -Labs reviewed:  HgB A1C 4.9, lipid panel WNL -Continue to participate in therapeutic milieu and groups -Social worker to obtain collateral information from mom to help and discharge planning  Grace Hatchet May Agustin, NP [facility] 06/06/2016, 11:58 AM  Reviewed the information documented and agree with the treatment plan.  [facility] 06/06/2016 3:39 PM

## 2016-06-06 NOTE — [facility] Group Notes (Signed)
[facility] Group Notes:  (Clinical Social Work)  06/06/2016  11:15-12:00PM  Summary of Progress/Problems:   Today's process group involved patients discussing their feelings related to being hospitalized, as well as how they can use their feelings to create an aftercare plan that incorporates some of the hospital's positives for improved mental and physical health going forward.  Particularly emphasized by many participants was the need for a routine and the wisdom of staying away from drugs and alcohol. The patient expressed her primary feeling about being hospitalized is very positive, because she is feeling better and feels the medications have been helping her significantly.  She talked about her need to stay away from alcohol when she leaves the hospital in order to not have any negative effect on her medication that she stated she has to take regularly now, every 28 days in the form of an injection.  She was very interactive and positive with other patients.  Type of Therapy:  Group Therapy - Process  Participation Level:  Active  Participation Quality:  Attentive, Sharing and Supportive  Affect:  Appropriate  Cognitive:  Appropriate  Insight:  Engaged  Engagement in Therapy:  Engaged  Modes of Intervention:  Exploration, Discussion  Ambrose Mantle, LCSW 06/06/2016, 12:58 PM

## 2016-06-06 NOTE — Progress Notes (Signed)
Patient ID: Grace Morris, female   DOB: [birth year], 25 y.o.   MRN: [id number]   D: Pt has been very flat and depressed on the unit today. Pt did not attend any groups nor did she engage in any treatment. Pt remained very isolative and guarded. Pt reported that her depression was a 0, her hopelessness was a 0, and her anxiety was a 0. Pt reported that her goal was to rest and to speak with family. Pt also reported that her goal was to remain positive. Pt reported being negative SI/HI, no AH/VH noted. A: 15 min checks continued for patient safety. R: Pt safety maintained.

## 2016-06-06 NOTE — Progress Notes (Signed)
Adult Psychoeducational Group Note  Date:  06/06/2016 Time:  9:09 PM  Group Topic/Focus:  Wrap-Up Group:   The focus of this group is to help patients review their daily goal of treatment and discuss progress on daily workbooks.  Participation Level:  Active  Participation Quality:  Appropriate  Affect:  Appropriate  Cognitive:  Appropriate  Insight: Appropriate  Engagement in Group:  Engaged  Modes of Intervention:  Discussion  Additional Comments:  The patient expressed that she attended group.The patient also said that group was about being responsible for your daily routine.  Octavio Manns 06/06/2016, 9:09 PM

## 2016-06-07 NOTE — Progress Notes (Signed)
Adult Psychoeducational Group Note  Date:  06/07/2016 Time:  8:15 PM  Group Topic/Focus:  Wrap-Up Group:   The focus of this group is to help patients review their daily goal of treatment and discuss progress on daily workbooks.   Participation Level:  Active  Participation Quality:  Appropriate  Affect:  Appropriate  Cognitive:  Appropriate  Insight: Good  Engagement in Group:  Engaged  Modes of Intervention:  Discussion  Additional Comments:  Pt rated her overall day a 9 out of 10 and stated that her day was "pretty good". Pt reported that she achieved her goal for the day, which was to stay positive about going home tomorrow and remain focused.   Grace Morris 06/07/2016, 8:39 PM

## 2016-06-07 NOTE — Progress Notes (Signed)
[facility] MD Progress Note  06/07/2016 11:08 AM Grace Morris  MRN:  [id number]   Subjective:  Patient states, "I'm real upbeat today.  I'm supposed to stay with my Grandma.  She will help me stay on my meds.  I used to be on Cymbalta and  Latuda.  I just took Cymbalta and I had a psychotic break.  I realize that I need to stay on my medications"  Objective:  Grace Morris is 25 yo came in with bizarre behaviors and AH.  She received her Haldol Dec IM dose 7/21, tolerated.  Grace Morris remains future oriented towards discharge.    Principal Problem: Bipolar disorder, current episode manic, severe with psychotic features (HCC) Diagnosis:   Patient Active Problem List   Diagnosis Date Noted  . Bipolar disorder, current episode manic, severe with psychotic features (HCC) [F31.2] 06/01/2016   Total Time spent with patient: 20 minutes  Past Psychiatric History: MDD, substance abuse  Past Medical History:  Past Medical History:  Diagnosis Date  . Bipolar disorder (HCC)   . No pertinent past medical history     Past Surgical History:  Procedure Laterality Date  . NO PAST SURGERIES     Family History:  Family History  Problem Relation Age of Onset  . Other Neg Hx   . Cancer Paternal Uncle   . Diabetes Paternal Grandmother    Family Psychiatric  History: MDD Social History:  History  Alcohol Use  . Yes    Comment: 1 drink every month     History  Drug Use  . Types: Marijuana    Social History   Social History  . Marital status: Single    Spouse name: N/A  . Number of children: N/A  . Years of education: N/A   Social History Main Topics  . Smoking status: Current Every Day Smoker    Types: Cigarettes  . Smokeless tobacco: Never Used  . Alcohol use Yes     Comment: 1 drink every month  . Drug use:     Types: Marijuana  . Sexual activity: Yes    Birth control/ protection: IUD   Other Topics Concern  . None   Social History Narrative  . None   Additional Social History:      Sleep: Fair  Appetite:  Fair  Current Medications: Current Facility-Administered Medications  Medication Dose Route Frequency Provider Last Rate Last Dose  . acetaminophen (TYLENOL) tablet 650 mg  650 mg Oral Q6H PRN Nelly Rout, MD      . alum & mag hydroxide-simeth (MAALOX/MYLANTA) 200-200-20 MG/5ML suspension 30 mL  30 mL Oral Q4H PRN Nelly Rout, MD      . benztropine (COGENTIN) tablet 0.5 mg  0.5 mg Oral BID Nelly Rout, MD   0.5 mg at 06/07/16 3094   Or  . benztropine mesylate (COGENTIN) injection 0.5 mg  0.5 mg Intramuscular BID Nelly Rout, MD      . carbamazepine (TEGRETOL) tablet 200 mg  200 mg Oral BID Nelly Rout, MD   200 mg at 06/07/16 0768  . haloperidol (HALDOL) tablet 5 mg  5 mg Oral QPC breakfast Nelly Rout, MD   5 mg at 06/07/16 0881  . magnesium hydroxide (MILK OF MAGNESIA) suspension 30 mL  30 mL Oral Daily PRN Nelly Rout, MD      . traZODone (DESYREL) tablet 50 mg  50 mg Oral QHS Court Joy, PA-C        Lab Results:  No results  found for this or any previous visit (from the past 48 hour(s)).  Blood Alcohol level:  Lab Results  Component Value Date   ETH <5 06/01/2016    Metabolic Disorder Labs: Lab Results  Component Value Date   HGBA1C 4.9 06/02/2016   MPG 94 06/02/2016   No results found for: PROLACTIN Lab Results  Component Value Date   CHOL 123 06/02/2016   TRIG 59 06/02/2016   HDL 34 (L) 06/02/2016   CHOLHDL 3.6 06/02/2016   VLDL 12 06/02/2016   LDLCALC 77 06/02/2016    Physical Findings: AIMS: Facial and Oral Movements Muscles of Facial Expression: None, normal Lips and Perioral Area: None, normal Jaw: None, normal Tongue: None, normal,Extremity Movements Upper (arms, wrists, hands, fingers): None, normal Lower (legs, knees, ankles, toes): None, normal, Trunk Movements Neck, shoulders, hips: None, normal, Overall Severity Severity of abnormal movements (highest score from questions above): None,  normal Incapacitation due to abnormal movements: None, normal Patient's awareness of abnormal movements (rate only patient's report): No Awareness, Dental Status Current problems with teeth and/or dentures?: No Does patient usually wear dentures?: No  CIWA:    COWS:      Musculoskeletal: Strength & Muscle Tone: within normal limits Gait & Station: normal Patient leans: N/A  Psychiatric Specialty Exam: Physical Exam  Nursing note and vitals reviewed. Psychiatric: Her mood appears anxious. Her affect is labile. Thought content is not paranoid. She exhibits a depressed mood. She expresses no homicidal and no suicidal ideation.    Review of Systems  Constitutional: Negative.  Negative for fever and malaise/fatigue.  HENT: Negative.  Negative for congestion and sore throat.   Eyes: Negative.  Negative for blurred vision, double vision, discharge and redness.  Respiratory: Negative.  Negative for cough.   Cardiovascular: Negative.  Negative for chest pain and palpitations.  Gastrointestinal: Negative.  Negative for abdominal pain, constipation, diarrhea, heartburn, nausea and vomiting.  Genitourinary: Negative.  Negative for dysuria and urgency.  Musculoskeletal: Negative.  Negative for falls and myalgias.  Skin: Negative.  Negative for rash.  Neurological: Negative.  Negative for dizziness, focal weakness, loss of consciousness, weakness and headaches.  Endo/Heme/Allergies: Negative.  Negative for environmental allergies.  Psychiatric/Behavioral: Positive for depression and substance abuse. Negative for hallucinations and suicidal ideas. The patient is nervous/anxious and has insomnia.   All other systems reviewed and are negative.   Blood pressure 106/63, pulse 70, temperature 98.1 F (36.7 C), temperature source Oral, resp. rate 18, height 5\' 1"  (1.549 m), weight 65.3 kg (144 lb), last menstrual period 06/01/2016.Body mass index is 27.21 kg/m.  General Appearance: Disheveled  Eye  Contact:  Fair  Speech:  Clear and Coherent and Pressured  Volume:  Normal  Mood:  Euphoric and Irritable  Affect:  Appropriate, Congruent and Labile  Thought Process:  Coherent, Linear and Descriptions of Associations: Circumstantial  Orientation:  Full (Time, Place, and Person)  Thought Content:  Symptoms, worries, concerns  Suicidal Thoughts:  No  Homicidal Thoughts:  No  Memory:  Immediate;   Fair Recent;   Fair Remote;   Fair  Judgement:  Fair  Insight:  Fair  Psychomotor Activity:  Mannerisms  Concentration:  Concentration: Fair and Attention Span: Fair  Recall:  [employer] of Knowledge:  Fair  Language:  Fair  Akathisia:  No  Handed:    AIMS (if indicated):     Assets:  Communication Skills Desire for Improvement Physical Health Resilience Social Support  ADL's:  Intact  Cognition:  WNL  Sleep:  Number of Hours: 4.75   Treatment Plan Summary: Bipolar disorder, current episode manic, severe with psychotic features (HCC), unstable, managed as below:  Medications: -continue Cogentin 0.5mg  po bid for EPS -continue Haldol  po bid for psychosis, Haldol Dec 50 mg received 7/21.  Tolerated well.  EKG WNL -continue Tegretol  po bid for mood stabilization -continue Trazodone  po qhs for insomnia -Labs reviewed:  HgB A1C 4.9, lipid panel WNL -Continue to participate in therapeutic milieu and groups -Social worker to obtain collateral information from mom to help and discharge planning  Grace Hatchet May Agustin, NP [facility] 06/07/2016, 11:08 AM  Reviewed the information documented and agree with the treatment plan.  Grace Morris 06/07/2016 11:51 AM

## 2016-06-07 NOTE — [facility] Group Notes (Signed)
[facility] Group Notes:  (Nursing/MHT/Case Management/Adjunct)  Date:  06/07/2016  Time:  4:24 PM  Type of Therapy:  Psychoeducational Skills  Participation Level:  Active  Participation Quality:  Appropriate  Affect:  Appropriate  Cognitive:  Appropriate  Insight:  Appropriate  Engagement in Group:  Engaged  Modes of Intervention:  Discussion  Summary of Progress/Problems: Pt did attend self inventory group, pt reported that she was negative SI/HI, no AH/VH noted. Pt rated her depression as a 0, and her helplessness/hopelessness as a 0.  Pt reported that her healthy support system was GOD and her grandmother.    Jacquelyne Balint Shanta 06/07/2016, 4:24 PM

## 2016-06-07 NOTE — Plan of Care (Signed)
Problem: Activity: Goal: Interest or engagement in activities will improve Outcome: Progressing Pt has attended group this weekend

## 2016-06-07 NOTE — [facility] Group Notes (Signed)
[facility] Group Notes:  (Clinical Social Work)  06/07/2016  11:00AM-12:00PM  Summary of Progress/Problems:  The main focus of today's process group was to listen to a variety of genres of music and to identify that different types of music provoke different responses.  The patient then was able to identify personally what was soothing for them, as well as energizing, as well as how patient can personally use this knowledge in sleep habits, with depression, and with other symptoms.  The patient expressed at the beginning of group the overall feeling of "jittery and bored" but then stretched out lying between two chairs and went to sleep.  Type of Therapy:  Music Therapy   Participation Level:  Minimal  Participation Quality:  Drowsy  Affect:  Blunted  Cognitive:  Oriented  Insight:  Limited  Engagement in Therapy:  Poor  Modes of Intervention:   Activity, Exploration  Ambrose Mantle, LCSW 06/07/2016

## 2016-06-07 NOTE — Progress Notes (Signed)
D.  Pt pleasant on approach, denies complaints at this time.  Pt reports she did not sleep through the night last night but continues to be hesitant to take Trazodone for sleep.  Pt states that she tends to have any available side effect that could happen when she takes medication so she is always cautious.  Pt did state that if she is unable to sleep she will come up and ask for it.  Pt denies SI/HI/hallucinations at this time.  Pt was positive for evening wrap up group and was observed appropriately engaging peers on the unit.  A. Support and encouragement offered  R.  Pt remains safe on the unit, will continue to monitor.

## 2016-06-07 NOTE — Progress Notes (Signed)
Patient ID: Grace Morris, female   DOB: 01-16-1991, 25 y.o.   MRN: [id number]    D: Pt has been appropriate on the unit, she has attended all groups and engaged in treatment. Pt reported that her depression was none, her hopelessness was none, and her anxiety was none. Pt reported that her goal was spirituality. Pt also reported that her goal was to remain focused on getting better. Pt reported being negative SI/HI, no AH/VH noted. A: 15 min checks continued for patient safety. R: Pt safety maintained.

## 2016-06-08 LAB — HIV ANTIBODY (ROUTINE TESTING W REFLEX): HIV SCREEN 4TH GENERATION: NONREACTIVE

## 2016-06-08 MED ORDER — CARBAMAZEPINE 200 MG PO TABS: 200.0000 mg | ORAL_TABLET | Freq: Two times a day (BID) | ORAL | 0 refills | Status: DC

## 2016-06-08 MED ORDER — HALOPERIDOL DECANOATE 100 MG/ML IM SOLN: 50.0000 mg | INTRAMUSCULAR | 0 refills | Status: DC

## 2016-06-08 MED ORDER — BENZTROPINE MESYLATE 0.5 MG PO TABS: 0.5000 mg | ORAL_TABLET | Freq: Two times a day (BID) | ORAL | 0 refills | Status: DC

## 2016-06-08 MED ORDER — TRAZODONE HCL 50 MG PO TABS: 50.0000 mg | ORAL_TABLET | Freq: Every day | ORAL | 0 refills | Status: DC

## 2016-06-08 MED ORDER — HALOPERIDOL DECANOATE 100 MG/ML IM SOLN: 50.0000 mg | INTRAMUSCULAR | Status: DC

## 2016-06-08 MED ORDER — HALOPERIDOL 5 MG PO TABS: 5.0000 mg | ORAL_TABLET | Freq: Every day | ORAL | 0 refills | Status: DC

## 2016-06-08 NOTE — [facility] Suicide Risk Assessment (Signed)
[facility] Discharge Suicide Risk Assessment   Principal Problem: Bipolar disorder, current episode manic, severe with psychotic features [facility]) Discharge Diagnoses:  Patient Active Problem List   Diagnosis Date Noted  . Bipolar disorder, current episode manic, severe with psychotic features (HCC) [F31.2] 06/01/2016    Total Time spent with patient: 30 minutes  Musculoskeletal: Strength & Muscle Tone: within normal limits Gait & Station: normal Patient leans: N/A  Psychiatric Specialty Exam: Review of Systems  Psychiatric/Behavioral: Negative for depression, hallucinations and suicidal ideas. The patient is not nervous/anxious and does not have insomnia.   All other systems reviewed and are negative.   Blood pressure 112/75, pulse 84, temperature 97.8 F (36.6 C), temperature source Oral, resp. rate 20, height 5\' 1"  (1.549 m), weight 65.3 kg (144 lb), last menstrual period 06/01/2016.Body mass index is 27.21 kg/m.  General Appearance: Casual  Eye Contact::  Fair  Speech:  Clear and Coherent409  Volume:  Normal  Mood:  Euthymic  Affect:  Appropriate  Thought Process:  Goal Directed and Descriptions of Associations: Intact  Orientation:  Full (Time, Place, and Person)  Thought Content:  Logical  Suicidal Thoughts:  No  Homicidal Thoughts:  No  Memory:  Immediate;   Fair Recent;   Fair Remote;   Fair  Judgement:  Fair  Insight:  Fair  Psychomotor Activity:  Normal  Concentration:  Fair  Recall:  [employer] of Knowledge:Fair  Language: Fair  Akathisia:  No  Handed:  Right  AIMS (if indicated):   0  Assets:  Desire for Improvement  Sleep:  Number of Hours: 4.75  Cognition: WNL  ADL's:  Intact   Mental Status Per Nursing Assessment::   On Admission:  NA  Demographic Factors:  Unemployed  Loss Factors: Financial problems/change in socioeconomic status  Historical Factors: Impulsivity  Risk Reduction Factors:   Positive social support  Continued Clinical Symptoms:   Bipolar Disorder:   Mixed State  Cognitive Features That Contribute To Risk:  None    Suicide Risk:  Minimal: No identifiable suicidal ideation.  Patients presenting with no risk factors but with morbid ruminations; may be classified as minimal risk based on the severity of the depressive symptoms    Plan Of Care/Follow-up recommendations:  Activity:  no restrictions Diet:  regular Tests:  pending std TEST results - to be called to [id number] ONCE RESULTED Other:  Haldol decanoate 50 mg IM last dose 06/05/16 - repeat q28 days  Kamya Watling, MD 06/08/2016, 10:07 AM

## 2016-06-08 NOTE — Progress Notes (Signed)
Patient discharged per physician note; patient denies SI/HI and A/V hallucinations; patient received samples, prescriptions, AVS, transition record, and discharge suicide risk assessment note after it was reviewed; patient had no other questions or concerns at this time; patient verbalized and signed that all belongings were received; patient left the unit ambulatory

## 2016-06-08 NOTE — Discharge Summary (Signed)
Physician Discharge Summary Note  Patient:  Grace Morris is an 25 y.o., female MRN:  [id number] DOB:  Jun 25, 1991 Patient phone:  [phone] (home)  Patient address:   [address] [place] [address],  Total Time spent with patient: 30 minutes  Date of Admission:  06/01/2016  Date of Discharge: 06-08-16  Reason for Admission: Worsening symptoms of Bipolar disorder  Principal Problem: Bipolar disorder, current episode manic, severe with psychotic features [facility]) Discharge Diagnoses: Patient Active Problem List   Diagnosis Date Noted  . Bipolar disorder, current episode manic, severe with psychotic features (HCC) [F31.2] 06/01/2016   Past Psychiatric History: See H&P  Past Medical History:  Past Medical History:  Diagnosis Date  . Bipolar disorder (HCC)   . No pertinent past medical history     Past Surgical History:  Procedure Laterality Date  . NO PAST SURGERIES     Family History:  Family History  Problem Relation Age of Onset  . Other Neg Hx   . Cancer Paternal Uncle   . Diabetes Paternal Grandmother    Family Psychiatric  History: See H&P  Social History:  History  Alcohol Use  . Yes    Comment: 1 drink every month     History  Drug Use  . Types: Marijuana    Social History   Social History  . Marital status: Single    Spouse name: N/A  . Number of children: N/A  . Years of education: N/A   Social History Main Topics  . Smoking status: Current Every Day Smoker    Types: Cigarettes  . Smokeless tobacco: Never Used  . Alcohol use Yes     Comment: 1 drink every month  . Drug use:     Types: Marijuana  . Sexual activity: Yes    Birth control/ protection: IUD   Other Topics Concern  . None   Social History Narrative  . None   Hospital Course: Patient is a 25 year old female transferred from [place] long ED involuntarily for stabilization of dangerous disruptive behaviors, which includes her standing out in the front lawn of her mom's house  yelling and being bizarre. Patient reported that she heard voices telling her to do bad things. Patient also reports this morning that she is trying to heal herself, wants to serve [employer]. 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 Patient reports that she's not been able to sleep well at night, is awake most of the times. She adds that she has difficulty in concentration, has difficulty in keeping up with her thoughts. She also reports being irritable, being sad and tearful at times, feeling that no one really cares for her. She denies any symptoms of anxiety or panic attacks. On being questioned how long this was going on, patient stated that she's not sure, recently moved from [place] to [place] to be with her mom.  After evaluation of her symptoms, Grace Morris received mood stabilization treatments using: Haldol 5 mg for mood control, Tegretol 200 mg for mood stabilization, Haldol Decanoate 100mg /ml injectable Q 28 days for mood control, Benztropine 5 mg for EPS. She was resumed on all her pertinent home medications for her other medical issues presented. She tolerated her treatment regimen without any adverse effects or reactions.  Besides the mood stabilization treatments, Grace Morris was also enrolled & participated in the group counseling sessions being offered & held on this unit. He learned coping skills.  And because of the chronic nature of her mental  illness & the tendency for this patient to be noncompliant to her treatment regimen, Grace Morris was started on the injectable form of haldol on monthly basis. This will not only help her symptoms, but will also aid in her treatment compliant. She is currently being discharged to continue mental health care on outpatient basis as noted below.   Upon discharge, she adamantly denies any SIHI, AVH, delusional thoughts or paranoia. She was provided with a 7 days worth, supply samples of her [facility] discharge medications. She left [facility] in no apparent  distress with all belongings. Transportation per family (Grandparents).   Physical Findings: AIMS: Facial and Oral Movements Muscles of Facial Expression: None, normal Lips and Perioral Area: None, normal Jaw: None, normal Tongue: None, normal,Extremity Movements Upper (arms, wrists, hands, fingers): None, normal Lower (legs, knees, ankles, toes): None, normal, Trunk Movements Neck, shoulders, hips: None, normal, Overall Severity Severity of abnormal movements (highest score from questions above): None, normal Incapacitation due to abnormal movements: None, normal Patient's awareness of abnormal movements (rate only patient's report): No Awareness, Dental Status Current problems with teeth and/or dentures?: No Does patient usually wear dentures?: No  CIWA:    COWS:     Musculoskeletal: Strength & Muscle Tone: within normal limits Gait & Station: normal Patient leans: N/A  Psychiatric Specialty Exam: Physical Exam  Constitutional: She appears well-developed.  HENT:  Head: Normocephalic.  Eyes: Pupils are equal, round, and reactive to light.  Neck: Normal range of motion.  Cardiovascular: Normal rate.   Respiratory: Effort normal.  GI: Soft.  Genitourinary:  Genitourinary Comments: Denies any issues in this area  Musculoskeletal: Normal range of motion.  Neurological: She is alert.  Skin: Skin is warm.    Review of Systems  Constitutional: Negative.   HENT: Negative.   Eyes: Negative.   Respiratory: Negative.   Cardiovascular: Negative.   Gastrointestinal: Negative.   Genitourinary: Negative.   Musculoskeletal: Negative.   Skin: Negative.   Psychiatric/Behavioral: Positive for depression (Stable). Negative for memory loss and suicidal ideas. The patient has insomnia (Stable). The patient is not nervous/anxious.     Blood pressure 112/75, pulse 84, temperature 97.8 F (36.6 C), temperature source Oral, resp. rate 20, height 5\' 1"  (1.549 m), weight 65.3 kg (144 lb),  last menstrual period 06/01/2016.Body mass index is 27.21 kg/m.  See Md's SRA   Have you used any form of tobacco in the last 30 days? (Cigarettes, Smokeless Tobacco, Cigars, and/or Pipes): Yes  Has this patient used any form of tobacco in the last 30 days? (Cigarettes, Smokeless Tobacco, Cigars, and/or Pipes): No  Blood Alcohol level:  Lab Results  Component Value Date   ETH <5 06/01/2016   Metabolic Disorder Labs:  Lab Results  Component Value Date   HGBA1C 4.9 06/02/2016   MPG 94 06/02/2016   No results found for: PROLACTIN Lab Results  Component Value Date   CHOL 123 06/02/2016   TRIG 59 06/02/2016   HDL 34 (L) 06/02/2016   CHOLHDL 3.6 06/02/2016   VLDL 12 06/02/2016   LDLCALC 77 06/02/2016   See Psychiatric Specialty Exam and Suicide Risk Assessment completed by Attending Physician prior to discharge.  Discharge destination:  Home  Is patient on multiple antipsychotic therapies at discharge:  No   Has Patient had three or more failed trials of antipsychotic monotherapy by history:  No  Recommended Plan for Multiple Antipsychotic Therapies: NA    Medication List    STOP taking these medications   cholecalciferol 1000 units  tablet Commonly known as:  VITAMIN D   DULoxetine 30 MG capsule Commonly known as:  CYMBALTA   levothyroxine 25 MCG tablet Commonly known as:  SYNTHROID, LEVOTHROID   lurasidone 40 MG Tabs tablet Commonly known as:  LATUDA   multivitamin with minerals Tabs tablet     TAKE these medications     Indication  benztropine 0.5 MG tablet Commonly known as:  COGENTIN Take 1 tablet (0.5 mg total) by mouth 2 (two) times daily. For prevention of drug induced tremors  Indication:  Extrapyramidal Reaction caused by Medications   carbamazepine 200 MG tablet Commonly known as:  TEGRETOL Take 1 tablet (200 mg total) by mouth 2 (two) times daily. For mood stabilization  Indication:  Manic-Depression   haloperidol 5 MG tablet Commonly known  as:  HALDOL Take 1 tablet (5 mg total) by mouth daily after breakfast. For mood control  Indication:  Mood control   haloperidol decanoate 100 MG/ML injection Commonly known as:  HALDOL DECANOATE Inject 0.5 mLs (50 mg total) into the muscle every 28 (twenty-eight) days. (Due 07-03-16): For mood control Start taking on:  07/03/2016  Indication:  Mood control   traZODone 50 MG tablet Commonly known as:  DESYREL Take 1 tablet (50 mg total) by mouth at bedtime. For sleep  Indication:  Trouble Sleeping      Follow-up recommendations:   Activity:  As tolerated Diet: As recommended by your primary care doctor. Keep all scheduled follow-up appointments as recommended.  Comments: Patient is instructed prior to discharge to: Take all medications as prescribed by his/her mental healthcare provider. Report any adverse effects and or reactions from the medicines to his/her outpatient provider promptly. Patient has been instructed & cautioned: To not engage in alcohol and or illegal drug use while on prescription medicines. In the event of worsening symptoms, patient is instructed to call the crisis hotline, 911 and or go to the nearest ED for appropriate evaluation and treatment of symptoms. To follow-up with his/her primary care provider for your other medical issues, concerns and or health care needs.  Activity:  As tolerated Diet: As recommended by your primary care doctor. Keep all scheduled follow-up appointments as recommended.  Signed: Sanjuana Kava, NP, PMHNP, FNP-BC 06/08/2016, 11:00 AM

## 2016-06-08 NOTE — Progress Notes (Signed)
Labs drawn this morning, put in too late for draw last night per lab.

## 2016-06-08 NOTE — Progress Notes (Signed)
  [facility] Adult Case Management Discharge Plan :  Will you be returning to the same living situation after discharge:  No. At discharge, do you have transportation home?: Yes,  Grandparents Do you have the ability to pay for your medications: Yes,  mental health  Release of information consent forms completed and in the chart;  Patient's signature needed at discharge.  Patient to Follow up at: Follow-up Information    [facility] Follow up on 06/10/2016.   Why:  Bring your ID and SS card to your appointment on Wednesday at 8:30. Contact information: [address] [place] [address] [phone]           Next level of care provider has access to [facility] Link:no  Safety Planning and Suicide Prevention discussed: Yes,  yes  Have you used any form of tobacco in the last 30 days? (Cigarettes, Smokeless Tobacco, Cigars, and/or Pipes): Yes  Has patient been referred to the Quitline?: Yes, faxed on 06/08/16  Patient has been referred for addiction treatment: Yes  Ida Rogue 06/08/2016, 11:47 AM

## 2016-06-09 ENCOUNTER — Telehealth: Payer: Self-pay | Admitting: Psychiatry

## 2016-06-09 ENCOUNTER — Encounter: Payer: Self-pay | Admitting: Psychiatry

## 2016-06-09 LAB — HEPATITIS PANEL, ACUTE
HCV Ab: <0.1 s/co ratio (ref 0.0–0.9)
HEP B C IGM: NEGATIVE
HEP B S AG: NEGATIVE
Hep A IgM: NEGATIVE

## 2016-06-09 LAB — GC/CHLAMYDIA PROBE AMP (~~LOC~~) NOT AT ARMC
CHLAMYDIA, DNA PROBE: NEGATIVE
NEISSERIA GONORRHEA: NEGATIVE

## 2016-06-09 LAB — RPR: RPR: NONREACTIVE

## 2016-06-09 NOTE — Telephone Encounter (Signed)
Called Grace Morris to discuss her lab work up results - HIV, Hep B,C, GC/CHL, RPR - all NR.

## 2016-06-24 ENCOUNTER — Other Ambulatory Visit (HOSPITAL_COMMUNITY): Payer: Self-pay | Admitting: Licensed Clinical Social Worker

## 2016-06-26 ENCOUNTER — Other Ambulatory Visit (HOSPITAL_COMMUNITY): Payer: Self-pay | Attending: Psychiatry | Admitting: Licensed Clinical Social Worker

## 2016-06-26 DIAGNOSIS — F3110 Bipolar disorder, current episode manic without psychotic features, unspecified: Secondary | ICD-10-CM | POA: Insufficient documentation
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
## 2016-08-07 ENCOUNTER — Emergency Department (HOSPITAL_COMMUNITY)
Admission: EM | Admit: 2016-08-07 | Discharge: 2016-08-08 | Disposition: A | Discharge status: Other Acute Inpatient Hospital | Payer: Federal, State, Local not specified - Other | Attending: Emergency Medicine | Admitting: Emergency Medicine

## 2016-08-07 ENCOUNTER — Encounter (HOSPITAL_COMMUNITY): Payer: Self-pay | Admitting: Emergency Medicine

## 2016-08-07 DIAGNOSIS — F17210 Nicotine dependence, cigarettes, uncomplicated: Secondary | ICD-10-CM | POA: Insufficient documentation

## 2016-08-07 DIAGNOSIS — F3164 Bipolar disorder, current episode mixed, severe, with psychotic features: Secondary | ICD-10-CM | POA: Insufficient documentation

## 2016-08-07 DIAGNOSIS — Z79899 Other long term (current) drug therapy: Secondary | ICD-10-CM | POA: Insufficient documentation

## 2016-08-07 LAB — COMPREHENSIVE METABOLIC PANEL
ALT: 18 U/L (ref 14–54)
ANION GAP: 9 (ref 5–15)
AST: 17 U/L (ref 15–41)
Albumin: 4.5 g/dL (ref 3.5–5.0)
Alkaline Phosphatase: 60 U/L (ref 38–126)
BUN: 12 mg/dL (ref 6–20)
CHLORIDE: 104 mmol/L (ref 101–111)
CO2: 26 mmol/L (ref 22–32)
Calcium: 9.2 mg/dL (ref 8.9–10.3)
Creatinine, Ser: 0.94 mg/dL (ref 0.44–1.00)
GFR calc non Af Amer: >60 mL/min (ref >60)
Glucose, Bld: 89 mg/dL (ref 65–99)
POTASSIUM: 3.6 mmol/L (ref 3.5–5.1)
SODIUM: 139 mmol/L (ref 135–145)
Total Bilirubin: 0.5 mg/dL (ref 0.3–1.2)
Total Protein: 7.1 g/dL (ref 6.5–8.1)

## 2016-08-07 LAB — CBC
HCT: 40.4 % (ref 36.0–46.0)
Hemoglobin: 13.7 g/dL (ref 12.0–15.0)
MCH: 29.3 pg (ref 26.0–34.0)
MCHC: 33.9 g/dL (ref 30.0–36.0)
MCV: 86.5 fL (ref 78.0–100.0)
PLATELETS: 166 10*3/uL (ref 150–400)
RBC: 4.67 MIL/uL (ref 3.87–5.11)
RDW: 12.9 % (ref 11.5–15.5)
WBC: 5.3 10*3/uL (ref 4.0–10.5)

## 2016-08-07 LAB — URINALYSIS, ROUTINE W REFLEX MICROSCOPIC
Bilirubin Urine: NEGATIVE
GLUCOSE, UA: NEGATIVE mg/dL
Hgb urine dipstick: NEGATIVE
Ketones, ur: 15 mg/dL — AB
LEUKOCYTES UA: NEGATIVE
Nitrite: NEGATIVE
PH: 6.5 (ref 5.0–8.0)
Protein, ur: NEGATIVE mg/dL
SPECIFIC GRAVITY, URINE: 1.022 (ref 1.005–1.030)

## 2016-08-07 LAB — RAPID URINE DRUG SCREEN, HOSP PERFORMED
AMPHETAMINES: NOT DETECTED
Barbiturates: NOT DETECTED
Benzodiazepines: NOT DETECTED
COCAINE: NOT DETECTED
OPIATES: NOT DETECTED
TETRAHYDROCANNABINOL: POSITIVE — AB

## 2016-08-07 LAB — SALICYLATE LEVEL

## 2016-08-07 LAB — ACETAMINOPHEN LEVEL

## 2016-08-07 LAB — ETHANOL: Alcohol, Ethyl (B): <5 mg/dL (ref <5)

## 2016-08-07 NOTE — ED Triage Notes (Addendum)
Per EMS, pt from home.  Pt c/o negative thoughts.  Pt on medication for bipolar and schizophrenia.  Pt does not have primary MD here and is out of her meds.  Pt having SI/HI.  Non specific.  Paranoia.  No medical complaints.  Vitals:  100/70, hr 96, resp 18, 98% ra.  cbg 90

## 2016-08-07 NOTE — ED Notes (Signed)
Patient noted sleeping in room. No complaints, stable, in no acute distress. Q15 minute rounds and monitoring via Security Cameras to continue.  

## 2016-08-07 NOTE — ED Notes (Signed)
Bed: [facility] Expected date:  Expected time:  Means of arrival:  Comments: 

## 2016-08-07 NOTE — ED Notes (Signed)
Report to include situation, background, assessment and recommendations from Olivette RN. Patient sleeping, respirations regular and unlabored. Q15 minute rounds and security camera observation to continue.   
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
## 2016-08-07 NOTE — Progress Notes (Signed)
Lord DNP recommended psychiatric inpatient treatment on 08/07/16. [facility].  Patient has been referred to the following facilities: [facility] - per Olegario MessierKathy, will look at it. [facility] - accepting referrals, per Bruce DonathBobby Rowan - left voicemail with call back number [facility]  [facility] does not take uninsured patients.  [facility] doesn't have [facility]S state funding.   At capacity:  [facility] (has only 2 geriatric beds) [facility] - per [facility] Dupl[facility] - [facility]  CSW will continue to seek placement.  [place] Abtsatia Melat Wrisley, LCSWA Disposition staff 08/07/2016 9:12 PM

## 2016-08-07 NOTE — ED Provider Notes (Signed)
[facility]-EMERGENCY DEPT Provider Note   CSN: [id number] Arrival date & time: 08/07/16  1402     History   Chief Complaint Chief Complaint  Patient presents with  . Suicidal    HPI TSERING LEAMAN is a 25 y.o. female with a past medical history bipolar disorder who presents with paranoia, auditory hallucinations, negative thoughts, and SI. Patient reports that she has a history of bipolar disorder for which she has several medications. She reports that last week, she wondered if she actually had mental health problems and decided to stop taking some of her medications. She reports she is only taking Tegretol currently. She reports that she did smoke marijuana once several days ago. She says that over the last several days, she has had increasingly negative thoughts that have led her to have suicidal ideation. Patient denies a specific plan but does say she has tried to hurt herself in the past. Patient says that she is hearing messages in her head from the devil telling her "negative things". She says that she is having paranoid thoughts that people are working against her including family members. She denied HI but said she had had thoughts of hurting others that are against her. Her only physical complaint today is some dysuria. Patient denies any fevers, chills, chest pain, shortness of breath, nausea, vomiting, constipation, diarrhea.   Patient said that she told her mother about her suicidal thoughts prompting her to call EMS for transport. Patient is here voluntarily.  The history is provided by the patient and medical records.  Mental Health Problem  Presenting symptoms: hallucinations (auditory), paranoid behavior and suicidal thoughts   Presenting symptoms: no aggressive behavior, no agitation, no homicidal ideas, no suicidal threats and no suicide attempt   Degree of incapacity (severity):  Severe Onset quality:  Gradual Duration:  3 days Timing:  Constant Progression:   Worsening Chronicity:  Recurrent Context: drug abuse and noncompliance   Context: not alcohol use   Treatment compliance:  Some of the time Time since last psychoactive medication taken:  1 week Relieved by:  Nothing Worsened by:  Nothing Ineffective treatments:  None tried Associated symptoms: insomnia   Associated symptoms: no abdominal pain, no chest pain, no fatigue and no headaches   Risk factors: hx of mental illness and recent psychiatric admission (last month)     Past Medical History:  Diagnosis Date  . Bipolar disorder (HCC)   . No pertinent past medical history     Patient Active Problem List   Diagnosis Date Noted  . Bipolar disorder, current episode manic, severe with psychotic features (HCC) 06/01/2016    Past Surgical History:  Procedure Laterality Date  . NO PAST SURGERIES      OB History    Gravida Para Term Preterm AB Living   1       1 0   SAB TAB Ectopic Multiple Live Births   0               Home Medications    Prior to Admission medications   Medication Sig Start Date End Date Taking? Authorizing Provider  carbamazepine (TEGRETOL) 200 MG tablet Take 1 tablet (200 mg total) by mouth 2 (two) times daily. For mood stabilization 06/08/16  Yes Sanjuana Kava, NP  levothyroxine (SYNTHROID, LEVOTHROID) 25 MCG tablet Take 25 mcg by mouth every morning. 05/05/16  Yes Historical Provider, MD  paliperidone (INVEGA) 6 MG 24 hr tablet Take 6 mg by mouth at bedtime.  Yes Historical Provider, MD  benztropine (COGENTIN) 0.5 MG tablet Take 1 tablet (0.5 mg total) by mouth 2 (two) times daily. For prevention of drug induced tremors Patient not taking: Reported on 08/07/2016 06/08/16   Sanjuana Kava, NP  haloperidol (HALDOL) 5 MG tablet Take 1 tablet (5 mg total) by mouth daily after breakfast. For mood control Patient not taking: Reported on 08/07/2016 06/08/16   Sanjuana Kava, NP  haloperidol decanoate (HALDOL DECANOATE) 100 MG/ML injection Inject 0.5 mLs (50 mg  total) into the muscle every 28 (twenty-eight) days. (Due 07-03-16): For mood control Patient not taking: Reported on 08/07/2016 07/03/16   Sanjuana Kava, NP  traZODone (DESYREL) 50 MG tablet Take 1 tablet (50 mg total) by mouth at bedtime. For sleep Patient not taking: Reported on 08/07/2016 06/08/16   Sanjuana Kava, NP    Family History Family History  Problem Relation Age of Onset  . Cancer Paternal Uncle   . Diabetes Paternal Grandmother   . Other Neg Hx     Social History Social History  Substance Use Topics  . Smoking status: Current Every Day Smoker    Types: Cigarettes  . Smokeless tobacco: Never Used  . Alcohol use Yes     Comment: 1 drink every month     Allergies   Lithium   Review of Systems Review of Systems  Constitutional: Negative for chills, diaphoresis and fatigue.  HENT: Negative for congestion and rhinorrhea.   Eyes: Negative for visual disturbance.  Respiratory: Negative for chest tightness, shortness of breath, wheezing and stridor.   Cardiovascular: Negative for chest pain.  Gastrointestinal: Negative for abdominal pain, diarrhea, nausea and vomiting.  Genitourinary: Positive for dysuria. Negative for flank pain and frequency.  Musculoskeletal: Negative for back pain, neck pain and neck stiffness.  Skin: Negative for rash and wound.  Neurological: Negative for weakness, numbness and headaches.  Psychiatric/Behavioral: Positive for hallucinations (auditory), paranoia and suicidal ideas. Negative for agitation, confusion and homicidal ideas. The patient has insomnia.   All other systems reviewed and are negative.    Physical Exam Updated Vital Signs BP 107/66   Pulse 75   Temp 98.2 F (36.8 C)   Resp 14   SpO2 98%   Physical Exam  Constitutional: She is oriented to person, place, and time. She appears well-developed and well-nourished. No distress.  HENT:  Head: Normocephalic and atraumatic.  Mouth/Throat: Oropharynx is clear and moist. No  oropharyngeal exudate.  Eyes: Conjunctivae and EOM are normal. Pupils are equal, round, and reactive to light.  Neck: Normal range of motion. Neck supple.  Cardiovascular: Normal rate and regular rhythm.   No murmur heard. Pulmonary/Chest: Effort normal and breath sounds normal. No stridor. No respiratory distress.  Abdominal: Soft. There is no tenderness.  Musculoskeletal: She exhibits no edema.  Neurological: She is alert and oriented to person, place, and time. No cranial nerve deficit. She exhibits normal muscle tone.  Skin: Skin is warm and dry. Capillary refill takes less than 2 seconds. No rash noted. She is not diaphoretic.  Psychiatric: Her mood appears anxious. She is actively hallucinating. She is not agitated and not aggressive. Thought content is paranoid. She expresses suicidal ideation. She expresses no homicidal ideation. She expresses no suicidal plans and no homicidal plans.  Nursing note and vitals reviewed.    ED Treatments / Results  Labs (all labs ordered are listed, but only abnormal results are displayed) Labs Reviewed  ACETAMINOPHEN LEVEL - Abnormal; Notable for the following:  Result Value   Acetaminophen (Tylenol), Serum <10 (*)    All other components within normal limits  URINE RAPID DRUG SCREEN, HOSP PERFORMED - Abnormal; Notable for the following:    Tetrahydrocannabinol POSITIVE (*)    All other components within normal limits  URINALYSIS, ROUTINE W REFLEX MICROSCOPIC (NOT AT [facility]) - Abnormal; Notable for the following:    APPearance CLOUDY (*)    Ketones, ur 15 (*)    All other components within normal limits  COMPREHENSIVE METABOLIC PANEL  ETHANOL  SALICYLATE LEVEL  CBC    EKG  EKG Interpretation None       Radiology No results found.  Procedures Procedures (including critical care time)  Medications Ordered in ED Medications - No data to display   Initial Impression / Assessment and Plan / ED Course  I have reviewed the  triage vital signs and the nursing notes.  Pertinent labs & imaging results that were available during my care of the patient were reviewed by me and considered in my medical decision making (see chart for details).  Clinical Course    Iran OuchBriann L Missildine is a 25 y.o. female with a past medical history bipolar disorder who presents with paranoia, auditory hallucinations, negative thoughts, and SI. Patient is here voluntarily for help. Given the patient's reported dysuria, patient will have urinalysis as well as screening laboratory testing. Patient's physical exam was unremarkable including normal neurologic exam.   Anticipate consultation with TTS after urinalysis.  Urinalysis negative for UTI. Patient is medically cleared for further management by psychiatry team.    Final Clinical Impressions(s) / ED Diagnoses   Final diagnoses:  Bipolar affective disorder, mixed, severe, with psychotic behavior [facility](HCC)    New Prescriptions New Prescriptions   No medications on file     Heide Scaleshristopher J Amiliana Foutz, MD 08/08/16 1128

## 2016-08-07 NOTE — ED Notes (Signed)
Pt wanded by security before transport.

## 2016-08-07 NOTE — Progress Notes (Addendum)
Admission Note: Pt is a 25 y/o AAF [facility] with diagnoses of Bipolar d/o and Schizophrenia. Pt ambulatory to [facility] from the main ED with c/o SI. Presents with depressed affect and mood with soft speech on initial approach. Endorsed passive SI, verbally contracts for safety. Denies HI, AVH and pain at the time. Pt reports racing thoughts, religious preoccupation, h/o medication noncompliance and impulsive behavior. Per pt "I take the medication and sometimes I don't because I feel like I don't need it so then I stop taking it, I don't like Tegretol, it makes me feel more worse". "I just want to know also what I'm diagnosed with; my grandmother on my mother's side is Bipolar and on my father's side they have lots of people with Schizophrenia". [place] from [place] to stay with her mother; states she has to return to [place] for a scheduled court date on 06/17/16, would not elaborate on incident leading to legal issues "I just did something stupid and I really want to be back on my medicines, I still don't have a doctor down here yet". Unit orientation and routines discussed, understanding verbalized. Support and availability provided. Encouraged to voice concerns. Q 15 minutes safety checks maintained without incident thus far.

## 2016-08-07 NOTE — BH Assessment (Signed)
[facility] Assessment Progress Note   Case was staffed with Lord DNP who recommended an inpatient admission as appropriate bed placement is investigated.     

## 2016-08-08 ENCOUNTER — Inpatient Hospital Stay (HOSPITAL_COMMUNITY)
Admission: AD | Admit: 2016-08-08 | Discharge: 2016-08-12 | DRG: 885 | Disposition: A | Discharge status: Home / Self Care | Payer: BLUE CROSS/BLUE SHIELD | Source: Intra-hospital | Attending: Psychiatry | Admitting: Psychiatry

## 2016-08-08 ENCOUNTER — Encounter (HOSPITAL_COMMUNITY): Payer: Self-pay | Admitting: *Deleted

## 2016-08-08 DIAGNOSIS — Z833 Family history of diabetes mellitus: Secondary | ICD-10-CM

## 2016-08-08 DIAGNOSIS — E039 Hypothyroidism, unspecified: Secondary | ICD-10-CM | POA: Diagnosis present

## 2016-08-08 DIAGNOSIS — R45851 Suicidal ideations: Secondary | ICD-10-CM | POA: Diagnosis present

## 2016-08-08 DIAGNOSIS — F1220 Cannabis dependence, uncomplicated: Secondary | ICD-10-CM | POA: Diagnosis present

## 2016-08-08 DIAGNOSIS — F4310 Post-traumatic stress disorder, unspecified: Secondary | ICD-10-CM | POA: Diagnosis present

## 2016-08-08 DIAGNOSIS — G4700 Insomnia, unspecified: Secondary | ICD-10-CM | POA: Diagnosis present

## 2016-08-08 DIAGNOSIS — F312 Bipolar disorder, current episode manic severe with psychotic features: Secondary | ICD-10-CM | POA: Diagnosis present

## 2016-08-08 DIAGNOSIS — Z79899 Other long term (current) drug therapy: Secondary | ICD-10-CM

## 2016-08-08 DIAGNOSIS — F3164 Bipolar disorder, current episode mixed, severe, with psychotic features: Principal | ICD-10-CM | POA: Diagnosis present

## 2016-08-08 DIAGNOSIS — Z91410 Personal history of adult physical and sexual abuse: Secondary | ICD-10-CM | POA: Diagnosis not present

## 2016-08-08 DIAGNOSIS — Z820 Family history of epilepsy and other diseases of the nervous system: Secondary | ICD-10-CM

## 2016-08-08 DIAGNOSIS — K219 Gastro-esophageal reflux disease without esophagitis: Secondary | ICD-10-CM | POA: Diagnosis present

## 2016-08-08 DIAGNOSIS — F17210 Nicotine dependence, cigarettes, uncomplicated: Secondary | ICD-10-CM | POA: Diagnosis present

## 2016-08-08 DIAGNOSIS — Z818 Family history of other mental and behavioral disorders: Secondary | ICD-10-CM | POA: Diagnosis not present

## 2016-08-08 MED ORDER — ALUM & MAG HYDROXIDE-SIMETH 200-200-20 MG/5ML PO SUSP: 30.0000 mL | ORAL | Status: DC | PRN

## 2016-08-08 MED ORDER — MAGNESIUM HYDROXIDE 400 MG/5ML PO SUSP: 30.0000 mL | Freq: Every day | ORAL | Status: DC | PRN

## 2016-08-08 MED ORDER — PALIPERIDONE ER 3 MG PO TB24
3.0000 mg | ORAL_TABLET | Freq: Every day | ORAL | Status: DC
  Administered 2016-08-09: 3 mg via ORAL
  Filled 2016-08-08 (×3): qty 1

## 2016-08-08 MED ORDER — LEVOTHYROXINE SODIUM 25 MCG PO TABS
25.0000 ug | ORAL_TABLET | Freq: Every day | ORAL | Status: DC
Start: 2016-08-09 — End: 2016-08-12
  Administered 2016-08-09 – 2016-08-12 (×4): 25 ug via ORAL
  Filled 2016-08-08 (×2): qty 1
  Filled 2016-08-08: qty 7
  Filled 2016-08-08 (×4): qty 1

## 2016-08-08 MED ORDER — LEVOTHYROXINE SODIUM 25 MCG PO TABS: 25.0000 ug | ORAL_TABLET | Freq: Every day | ORAL | Status: DC

## 2016-08-08 MED ORDER — OXCARBAZEPINE 300 MG PO TABS
300.0000 mg | ORAL_TABLET | Freq: Every day | ORAL | Status: DC
  Filled 2016-08-08 (×2): qty 1

## 2016-08-08 MED ORDER — TRAZODONE HCL 50 MG PO TABS: 50.0000 mg | ORAL_TABLET | Freq: Every evening | ORAL | Status: DC | PRN

## 2016-08-08 MED ORDER — ACETAMINOPHEN 325 MG PO TABS: 650.0000 mg | ORAL_TABLET | Freq: Four times a day (QID) | ORAL | Status: DC | PRN

## 2016-08-08 MED ORDER — PALIPERIDONE ER 3 MG PO TB24
3.0000 mg | ORAL_TABLET | Freq: Every day | ORAL | Status: DC
  Administered 2016-08-08: 3 mg via ORAL
  Filled 2016-08-08: qty 1

## 2016-08-08 MED ORDER — OXCARBAZEPINE 300 MG PO TABS
300.0000 mg | ORAL_TABLET | Freq: Every day | ORAL | Status: DC
  Administered 2016-08-08: 300 mg via ORAL
  Filled 2016-08-08: qty 1

## 2016-08-08 MED ORDER — ACETAMINOPHEN 325 MG PO TABS
650.0000 mg | ORAL_TABLET | Freq: Four times a day (QID) | ORAL | Status: DC | PRN
Start: 2016-08-08 — End: 2016-08-12

## 2016-08-08 MED ORDER — LEVOTHYROXINE SODIUM 25 MCG PO TABS
25.0000 ug | ORAL_TABLET | Freq: Every day | ORAL | Status: DC
Start: 2016-08-08 — End: 2016-08-08
  Administered 2016-08-08: 25 ug via ORAL
  Filled 2016-08-08 (×2): qty 1

## 2016-08-08 NOTE — ED Provider Notes (Signed)
  Physical Exam  BP 101/61 (BP Location: Right Arm)   Pulse (!) 59 Comment: Pt has been resting  Temp 98.9 F (37.2 C) (Oral)   Resp 16   SpO2 100%   Physical Exam  ED Course  Procedures  MDM Accepted [facility] Dr Elna BreslowEappen       Benjiman CoreNathan Little Bashore, MD 08/08/16 2029

## 2016-08-08 NOTE — ED Notes (Signed)
Patient noted sleeping in room. No complaints, stable, in no acute distress. Q15 minute rounds and monitoring via Security Cameras to continue.  

## 2016-08-08 NOTE — Tx Team (Signed)
Initial Treatment Plan 08/08/2016 11:35 PM Grace OuchBriann L Carrigg [id number]    PATIENT STRESSORS: Financial difficulties Legal issue Marital or family conflict Medication change or noncompliance Substance abuse   PATIENT STRENGTHS: Ability for insight Active sense of humor Average or above average intelligence Capable of independent living Communication skills General fund of knowledge Motivation for treatment/growth Physical Health Religious Affiliation Special hobby/interest Supportive family/friends Work skills   PATIENT IDENTIFIED PROBLEMS: "i came to get stable on some different medicine"  "to feel better    Increased risk for suicide  depression  Substance abuse           DISCHARGE CRITERIA:  Ability to meet basic life and health needs Adequate post-discharge living arrangements Improved stabilization in mood, thinking, and/or behavior Medical problems require only outpatient monitoring Motivation to continue treatment in a less acute level of care Need for constant or close observation no longer present Reduction of life-threatening or endangering symptoms to within safe limits Safe-care adequate arrangements made Verbal commitment to aftercare and medication compliance  PRELIMINARY DISCHARGE PLAN: Outpatient therapy Participate in family therapy Return to previous living arrangement  PATIENT/FAMILY INVOLVEMENT: This treatment plan has been presented to and reviewed with the patient, Grace Morris, and/or family member.  The patient and family have been given the opportunity to ask questions and make suggestions.  Doristine JohnsBrooks, [name], RN 08/08/2016, 11:35 PM

## 2016-08-08 NOTE — BH Assessment (Signed)
Received call from Aliene AltesJoanne Glover, [facility] who reported [facility] Bed 1 to the services of Dr. Elna BreslowEappen. Informed Dr. Rubin PayorPickering of the disposition. Will complete support paperwork.

## 2016-08-08 NOTE — ED Notes (Signed)
Up to the bathroom 

## 2016-08-08 NOTE — ED Notes (Signed)
Report to include Situation, Background, Assessment, and Recommendations received from Janie Rambo RN. Patient alert and oriented, warm and dry, in no acute distress. Patient denies SI, HI, AVH and pain. Patient made aware of Q15 minute rounds and security cameras for their safety. Patient instructed to come to me with needs or concerns.  

## 2016-08-08 NOTE — ED Notes (Signed)
Patient noted in room. No complaints, stable, in no acute distress. Q15 minute rounds and monitoring via Security Cameras to continue.  
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
## 2016-08-09 ENCOUNTER — Encounter (HOSPITAL_COMMUNITY): Payer: Self-pay | Admitting: Psychiatry

## 2016-08-09 DIAGNOSIS — F1220 Cannabis dependence, uncomplicated: Secondary | ICD-10-CM | POA: Diagnosis present

## 2016-08-09 DIAGNOSIS — R45851 Suicidal ideations: Secondary | ICD-10-CM

## 2016-08-09 DIAGNOSIS — F3164 Bipolar disorder, current episode mixed, severe, with psychotic features: Secondary | ICD-10-CM | POA: Diagnosis present

## 2016-08-09 DIAGNOSIS — F4310 Post-traumatic stress disorder, unspecified: Secondary | ICD-10-CM | POA: Clinically undetermined

## 2016-08-09 MED ORDER — OXCARBAZEPINE 300 MG PO TABS
300.0000 mg | ORAL_TABLET | Freq: Two times a day (BID) | ORAL | Status: DC
  Administered 2016-08-09 – 2016-08-12 (×6): 300 mg via ORAL
  Filled 2016-08-09: qty 1
  Filled 2016-08-09: qty 14
  Filled 2016-08-09 (×3): qty 1
  Filled 2016-08-09: qty 14
  Filled 2016-08-09 (×4): qty 1

## 2016-08-09 MED ORDER — PALIPERIDONE ER 3 MG PO TB24
3.0000 mg | ORAL_TABLET | Freq: Every day | ORAL | Status: DC
  Administered 2016-08-10 – 2016-08-11 (×2): 3 mg via ORAL
  Filled 2016-08-09 (×2): qty 1
  Filled 2016-08-09: qty 7
  Filled 2016-08-09: qty 1
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
## 2016-08-09 NOTE — B[facility] Group Notes (Signed)
[facility]  08/09/2016 11:00 AM   Type of Therapy:  Group Therapy  Participation Level:  Did Not Attend  Jeanelle MallingChelsea Adahlia Stembridge, LCSW 08/09/2016 4:07 PM
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
## 2016-08-09 NOTE — Progress Notes (Signed)
DAR NOTE: Patient presents with flat affect and depressed mood.  Denies pain, auditory and visual hallucinations.  Reports energy level as normal and concentration as good.  Rates depression at 3, hopelessness at 1, and anxiety at 2.  Maintained on routine safety checks.  Medications given as prescribed.  Support and encouragement offered as needed.  Attended group and participated.  States goal for today is "resting and staying positive."  Patient is withdrawn and isolative.  Minimal interaction with staff.  Patient was sad and tearful during group.

## 2016-08-09 NOTE — Progress Notes (Signed)
Patient ID: Grace Morris, female   DOB: 07/24/1991, 25 y.o.   MRN: [id number]  D: Patient mood and affect appeared anxious. Pt observed interacting well with peers.  Pt reports tolerating medication well. Pt denies SI/HI/AVH and pain. Pt attended and participated in evening wrap up group. Cooperative with assessment.   A: Support and encouragement provided. Pt encouraged to discuss feelings and come to staff with any question or concerns.   R: Patient remains safe.
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
## 2016-08-09 NOTE — Progress Notes (Signed)
Adult Psychoeducational Group Note  Date:  08/09/2016 Time:  9:26 PM  Group Topic/Focus:  Wrap-Up Group:   The focus of this group is to help patients review their daily goal of treatment and discuss progress on daily workbooks.   Participation Level:  Active  Participation Quality:  Appropriate and Attentive  Affect:  Appropriate  Cognitive:  Appropriate  Insight: Appropriate and Good  Engagement in Group:  Engaged  Modes of Intervention:  Discussion  Additional Comments:  Pt stated her goal for today was to be positive. Pt stated one positive thing for today is that she got her hair braided today. Caswell CorwinOwen, Kendricks Reap C 08/09/2016, 9:26 PM

## 2016-08-10 NOTE — Plan of Care (Signed)
Problem: Activity: Goal: Interest or engagement in leisure activities will improve Outcome: Progressing Pt interacting well with peers and attended evening wrap up group

## 2016-08-10 NOTE — Progress Notes (Signed)
[facility]  08/10/2016 1:30 PM Iran OuchBriann L Morris  MRN:  [id number]  Subjective: Grace Morris reports, "I'm doing okay. I'm just wondering if I can take my Trileptal just once a day because that was how I was taking it at home".  Objective: Grace L Davisis a 25 y.o.AAfemale, who is single , unemployed , lives in [place] with her mother , recently [place] from [place] , who has a hx of Bipolar disorder and cannabis abuse, [facility] mood sx and SI.  Pt presents this date after contacting EMS and informing them that her mental health symptoms associated with being Bipolar have escalated into thoughts of self harm.  Grace Morris is seen, chart reviewed. She is alert, oriented & verbally responsive. She is making good eye contact. She is lying in her bed in her room. She denies any new issues, however, wondering why she is taking her Trileptal twice daily instead of once a day as she is used to. She wanted to just take it once a day as that was how she was taking it home. Grace Morris was instructed that she is currently taking 300 mg Trileptal twice daily to meet her mood stabilization needs. That it will be too much of a dose to take once a day if the am & bedtime doses where combined as it may trigger the potential for adverse effects or reactions. She currently denies any suicidal, homicidal ideations, delusional thoughts or paranoia. She denies any adverse effects from her current treatment regimen. She is encouraged to participate in the group counseling sessions.  Principal Problem: Bipolar disorder, curr episode mixed, severe, with psychotic features (HCC)  Diagnosis:   Patient Active Problem List   Diagnosis Date Noted  . Bipolar disorder, curr episode mixed, severe, with psychotic features (HCC) [F31.64] 08/09/2016  . Cannabis use disorder, severe, dependence (HCC) [F12.20] 08/09/2016  . PTSD (post-traumatic stress disorder) [F43.10] 08/09/2016   Total Time spent with patient: 25  minutes  Past Psychiatric History: Bipolar affective disorder.  Past Medical History:  Past Medical History:  Diagnosis Date  . Bipolar disorder (HCC)   . No pertinent past medical history     Past Surgical History:  Procedure Laterality Date  . NO PAST SURGERIES     Family History:  Family History  Problem Relation Age of Onset  . Cancer Paternal Uncle   . Diabetes Paternal Grandmother   . Alcoholism Other   . Bipolar disorder Maternal Grandmother   . Multiple sclerosis Maternal Grandmother   . Other Neg Hx    Family Psychiatric  History: See H&P  Social History:  History  Alcohol Use  . Yes    Comment: 1 drink every month     History  Drug Use  . Types: Marijuana    Social History   Social History  . Marital status: Single    Spouse name: N/A  . Number of children: N/A  . Years of education: N/A   Social History Main Topics  . Smoking status: Current Every Day Smoker    Packs/day: 0.50    Years: 2.00    Types: Cigarettes  . Smokeless tobacco: Never Used  . Alcohol use Yes     Comment: 1 drink every month  . Drug use:     Types: Marijuana  . Sexual activity: Yes    Birth control/ protection: IUD   Other Topics Concern  . None   Social History Narrative  . None   Additional  Social History:    Pain Medications: See MAR Prescriptions: see MAR Over the Counter: See MAR History of alcohol / drug use?: Yes Longest period of sobriety (when/how long): unknown Negative Consequences of Use: Personal relationships Name of Substance 1: THC 1 - Age of First Use: 21 1 - Amount (size/oz): 2 grams 1 - Frequency: once or twice a week 1 - Last Use / Amount: 08/05/16 1 gram Name of Substance 2: etoh 2 - Age of First Use: 20 2 - Amount (size/oz): varies 2 - Frequency: varies 2 - Last Use / Amount: "few weeks ago a sip"  Sleep: Good, 6.75 per documentation.  Appetite:  Good  Current Medications: Current Facility-Administered Medications  Medication  Dose Route Frequency Provider Last Rate Last Dose  . acetaminophen (TYLENOL) tablet 650 mg  650 mg Oral Q6H PRN Charm Rings, NP      . alum & mag hydroxide-simeth (MAALOX/MYLANTA) 200-200-20 MG/5ML suspension 30 mL  30 mL Oral Q4H PRN Charm Rings, NP      . levothyroxine (SYNTHROID, LEVOTHROID) tablet 25 mcg  25 mcg Oral QAC breakfast Charm Rings, NP   25 mcg at 08/10/16 [phone]  . magnesium hydroxide (MILK OF MAGNESIA) suspension 30 mL  30 mL Oral Daily PRN Charm Rings, NP      . Oxcarbazepine (TRILEPTAL) tablet 300 mg  300 mg Oral BID Jomarie Longs, MD   300 mg at 08/10/16 0824  . paliperidone (INVEGA) 24 hr tablet 3 mg  3 mg Oral QHS Saramma Eappen, MD      . traZODone (DESYREL) tablet 50 mg  50 mg Oral QHS PRN Charm Rings, NP       Lab Results: No results found for this or any previous visit (from the past 48 hour(s)).  Blood Alcohol level:  Lab Results  Component Value Date   ETH <5 08/07/2016   ETH <5 06/01/2016   Metabolic Disorder Labs: Lab Results  Component Value Date   HGBA1C 4.9 06/02/2016   MPG 94 06/02/2016   No results found for: PROLACTIN Lab Results  Component Value Date   CHOL 123 06/02/2016   TRIG 59 06/02/2016   HDL 34 (L) 06/02/2016   CHOLHDL 3.6 06/02/2016   VLDL 12 06/02/2016   LDLCALC 77 06/02/2016   Physical Findings: AIMS: Facial and Oral Movements Muscles of Facial Expression: None, normal Lips and Perioral Area: None, normal Jaw: None, normal Tongue: None, normal,Extremity Movements Upper (arms, wrists, hands, fingers): None, normal Lower (legs, knees, ankles, toes): None, normal, Trunk Movements Neck, shoulders, hips: None, normal, Overall Severity Severity of abnormal movements (highest score from questions above): None, normal Incapacitation due to abnormal movements: None, normal Patient's awareness of abnormal movements (rate only patient's report): No Awareness, Dental Status Current problems with teeth and/or dentures?:  No Does patient usually wear dentures?: No  CIWA:  CIWA-Ar Total: 0 COWS:     Musculoskeletal: Strength & Muscle Tone: within normal limits Gait & Station: normal Patient leans: N/A  Psychiatric Specialty Exam: Physical Exam  Constitutional: She appears well-developed.  HENT:  Head: Normocephalic.  Eyes: Pupils are equal, round, and reactive to light.  Neck: Normal range of motion.  Cardiovascular: Normal rate.   Respiratory: Effort normal.  GI: Soft.  Genitourinary:  Genitourinary Comments: Deferred.  Musculoskeletal: Normal range of motion.  Neurological: She is alert.  Skin: Skin is warm.    Review of Systems  Constitutional: Negative.   HENT: Negative.   Eyes: Negative.  Respiratory: Negative.   Cardiovascular: Negative.   Gastrointestinal: Negative.   Genitourinary: Negative.   Musculoskeletal: Negative.   Skin: Negative.   Neurological: Negative.   Endo/Heme/Allergies: Negative.   Psychiatric/Behavioral: Positive for depression, hallucinations (Hx. Psychosis) and substance abuse (Hx. cannabis use disorder). Negative for memory loss and suicidal ideas. The patient is nervous/anxious and has insomnia.     Blood pressure 100/66, pulse 84, temperature 98.2 F (36.8 C), temperature source Oral, resp. rate 18, height 5\' 1"  (1.549 m), weight 68.5 kg (151 lb), last menstrual period 08/02/2016.Body mass index is 28.53 kg/m.  Per H&P documentation. General Appearance: Fairly Groomed  Eye Contact:  Fair  Speech:  Normal Rate  Volume:  Decreased  Mood:  Anxious, Depressed and Dysphoric  Affect:  Labile and Tearful  Thought Process:  Goal Directed and Descriptions of Associations: Circumstantial  Orientation:  Full (Time, Place, and Person)  Thought Content:  Delusions, Ideas of Reference:   Paranoia Delusions, Paranoid Ideation and Rumination  Suicidal Thoughts:  Yes.  without intent/plan on presentation to hospital  Homicidal Thoughts:  No  Memory:  Immediate;    Fair Recent;   Fair Remote;   Fair  Judgement:  Impaired  Insight:  Shallow  Psychomotor Activity:  Normal  Concentration:  Concentration: Fair and Attention Span: Fair  Recall:  [employer] of Knowledge:  Fair  Language:  Fair  Akathisia:  No  Handed:  Right  AIMS (if indicated):     Assets:  Desire for Improvement  ADL's:  Intact  Cognition:  WNL  Sleep:  Number of Hours: 5   Assessment: Karson L Davisis a 25 y.o.AAfemale, who is single , unemployed , lives in [place] with her mother , recently relocated here from [place] , who has a hx of Bipolar disorder and cannabis abuse, presented to [facility] with worsening mood sx and SI.  Pt presents this date after contacting EMS and informing them that her mental health symptoms associated with being Bipolar have escalated into thoughts of self harm.  Treatment Plan Summary: Daily contact with patient to assess and evaluate symptoms and progress in treatment and Medication management:  Mood control: Will continue the Paliperidone (Invega) 3 mg Q bedtime. Mood stability: Will continue the Trileptal 300 mg bid. Insomnia: Will continue Trazodone 50 mg prn for insomnia. Hypothyroidism: Will continue the Levothyroxine 25 mcg Q am. - Continue 15 minutes observation for safety concerns - Encouraged to participate in milieu therapy and group therapy counseling sessions and also work with coping skills -  Develop treatment plan to decrease risk of relapse upon discharge and to reduce the need for readmission. -  Psycho-social education regarding relapse prevention and self care. - Health care follow up as needed for medical problems. - Restart home medications where appropriate.  Sanjuana Kava, NP, PMHNP, FNP-BC 08/10/2016, 1:30 PM   Agree with NP progress note as above

## 2016-08-10 NOTE — [facility] Group Notes (Signed)
[facility] LCSW Group Therapy  08/10/2016 , 4:28 PM   Type of Therapy:  Group Therapy  Participation Level:  Active  Participation Quality:  Attentive  Affect:  Appropriate  Cognitive:  Alert  Insight:  Improving  Engagement in Therapy:  Engaged  Modes of Intervention:  Discussion, Exploration and Socialization  Summary of Progress/Problems: Today's group focused on the term Diagnosis.  Participants were asked to define the term, and then pronounce whether it is a negative, positive or neutral term.  Stayed the entire time; minimally engaged but not disruptive.  Cited family as good supports.  Made it clear she has an appointment with new outpatient provider on Thursday and would like to be there for that.  [name], Grace Morris B 08/10/2016 , 4:28 PM

## 2016-08-10 NOTE — Progress Notes (Signed)
DAR NOTE: Patient presents with flat affect and depressed mood.  Denies pain, auditory and visual hallucinations.  Described energy level as normal and concentration as good.  Rates depression at 0, hopelessness at 0, and anxiety at 0.  Maintained on routine safety checks.  Medications given as prescribed.  Support and encouragement offered as needed.  Attended group and participated.  States goal for today is "remaining positive."  Patient observed socializing with peers in the dayroom.  Offered no complaint.

## 2016-08-10 NOTE — Plan of Care (Signed)
Problem: Medication: Goal: Compliance with prescribed medication regimen will improve Outcome: Progressing Pt complaint with medication regime this evening

## 2016-08-10 NOTE — Progress Notes (Signed)
Patient ID: Grace Morris, female   DOB: 10/04/1991, 25 y.o.   MRN: [id number]  D: Patient mood and affect is appropriate to situation. Pt observed interacting well with peers.  Pt reports tolerating medication well but denies having a goal today. Pt denies SI/HI/AVH and pain. Pt attended and participated in evening wrap up group. Cooperative with assessment.   A: Support and encouragement provided. Medication administered as prescribed. Pt encouraged to discuss feelings and come to staff with any question or concerns.   R: Patient remains safe.

## 2016-08-11 NOTE — Progress Notes (Signed)
DAR NOTE: Patient presents with flat affect and depressed mood.  Denies pain, auditory and visual hallucinations.  Rates depression at 2, hopelessness at 0, and anxiety at .  Maintained on routine safety checks.  Medications given as prescribed.  Support and encouragement offered as needed.  Attended group and participated.  States goal for today is "get sleep."  Patient observed socializing with peers in the dayroom.  Offered no complaint.

## 2016-08-11 NOTE — [facility] Group Notes (Signed)
[facility] LCSW Group Therapy  08/11/2016 1:15 pm  Type of Therapy: Process Group Therapy  Participation Level:  Active  Participation Quality:  Appropriate  Affect:  Flat  Cognitive:  Oriented  Insight:  Improving  Engagement in Group:  Limited  Engagement in Therapy:  Limited  Modes of Intervention:  Activity, Clarification, Education, Problem-solving and Support  Summary of Progress/Problems: Today's group addressed the issue of overcoming obstacles.  Patients were asked to identify their biggest obstacle post d/c that stands in the way of their on-going success, and then problem solve as to how to manage this. Stayed the entire time, engaged throughout.  Declined to contribute until the end, when I noticed her wiping tears.  When asked, she stated that others talking about their family makes her think of her paternal grandmother who died, and who she misses very much.  Aroused much sympathy and empathy from a couple of other group members.  They encouraged her to continue to let her sadness out.  [name], Jeyren Danowski B 08/11/2016   3:01 PM

## 2016-08-11 NOTE — Progress Notes (Signed)
D: Pt denies SI/HI/AVH. Pt is pleasant and cooperative. Pt stated she was feeling better due to her thoughts being better.   A: Pt was offered support and encouragement. Pt was given scheduled medications. Pt was encourage to attend groups. Q 15 minute checks were done for safety.   R:Pt attends groups and interacts well with peers and staff. Pt is taking medication. Pt has no complaints.Pt receptive to treatment and safety maintained on unit.

## 2016-08-11 NOTE — Progress Notes (Signed)
Adult Psychoeducational Group Note  Date:  08/11/2016 Time:  9:04 PM  Group Topic/Focus:  Wrap-Up Group:   The focus of this group is to help patients review their daily goal of treatment and discuss progress on daily workbooks.   Participation Level:  Active  Participation Quality:  Appropriate  Affect:  Appropriate  Cognitive:  Appropriate  Insight: Appropriate  Engagement in Group:  Engaged  Modes of Intervention:  Discussion  Additional Comments: The patient expressed that she attend all groups.The patient also said that she rates today a 8. Octavio Mannshigpen, Mystie Ormand Lee 08/11/2016, 9:04 PM

## 2016-08-11 NOTE — Tx Team (Signed)
Interdisciplinary Treatment and Diagnostic Plan Update  08/11/2016 Time of Session: 1:58 PM  Grace Morris MRN: [id number]  Principal Diagnosis: Bipolar disorder, curr episode mixed, severe, with psychotic features ([place])  Secondary Diagnoses: Principal Problem:   Bipolar disorder, curr episode mixed, severe, with psychotic features ([place]) Active Problems:   Cannabis use disorder, severe, dependence ([place])   PTSD (post-traumatic stress disorder)   Current Medications:  Current Facility-Administered Medications  Medication Dose Route Frequency Provider Last Rate Last Dose  . acetaminophen (TYLENOL) tablet 650 mg  650 mg Oral Q6H PRN [name], NP      . alum & mag hydroxide-simeth (MAALOX/MYLANTA) 200-200-20 MG/5ML suspension 30 mL  30 mL Oral Q4H PRN [name], NP      . levothyroxine (SYNTHROID, LEVOTHROID) tablet 25 mcg  25 mcg Oral QAC breakfast [name], NP   25 mcg at 08/11/16 7829  . magnesium hydroxide (MILK OF MAGNESIA) suspension 30 mL  30 mL Oral Daily PRN [name], NP      . Oxcarbazepine (TRILEPTAL) tablet 300 mg  300 mg Oral BID [name], MD   300 mg at 08/11/16 0816  . paliperidone (INVEGA) 24 hr tablet 3 mg  3 mg Oral QHS [name], MD   3 mg at 08/10/16 2122  . traZODone (DESYREL) tablet 50 mg  50 mg Oral QHS PRN [name], NP        PTA Medications: Prescriptions Prior to Admission  Medication Sig Dispense Refill Last Dose  . benztropine (COGENTIN) 0.5 MG tablet Take 1 tablet (0.5 mg total) by mouth 2 (two) times daily. For prevention of drug induced tremors (Patient not taking: Reported on 08/07/2016) 60 tablet 0 Not Taking at Unknown time  . carbamazepine (TEGRETOL) 200 MG tablet Take 1 tablet (200 mg total) by mouth 2 (two) times daily. For mood stabilization 60 tablet 0 08/07/2016 at Unknown time  . haloperidol (HALDOL) 5 MG tablet Take 1 tablet (5 mg total) by mouth daily after breakfast. For mood control (Patient not taking:  Reported on 08/07/2016) 30 tablet 0 Not Taking at Unknown time  . haloperidol decanoate (HALDOL DECANOATE) 100 MG/ML injection Inject 0.5 mLs (50 mg total) into the muscle every 28 (twenty-eight) days. (Due 07-03-16): For mood control (Patient not taking: Reported on 08/07/2016) 1 mL 0 Not Taking at Unknown time  . levothyroxine (SYNTHROID, LEVOTHROID) 25 MCG tablet Take 25 mcg by mouth every morning.  1 08/07/2016 at Unknown time  . paliperidone (INVEGA) 6 MG 24 hr tablet Take 6 mg by mouth at bedtime.   Past Week at Unknown time  . traZODone (DESYREL) 50 MG tablet Take 1 tablet (50 mg total) by mouth at bedtime. For sleep (Patient not taking: Reported on 08/07/2016) 30 tablet 0 Not Taking at Unknown time    Treatment Modalities: Medication Management, Group therapy, Case management,  1 to 1 session with clinician, Psychoeducation, Recreational therapy.   Physician Treatment Plan for Primary Diagnosis: Bipolar disorder, curr episode mixed, severe, with psychotic features ([place]) Long Term Goal(s): Improvement in symptoms so as ready for discharge  Short Term Goals: Compliance with prescribed medications will improve  Medication Management: Evaluate patient's response, side effects, and tolerance of medication regimen.  Therapeutic Interventions: 1 to 1 sessions, Unit Group sessions and Medication administration.  Evaluation of Outcomes: Met  Physician Treatment Plan for Secondary Diagnosis: Principal Problem:   Bipolar disorder, curr episode mixed, severe, with psychotic features ([place]) Active Problems:  Cannabis use disorder, severe, dependence ([place])   PTSD (post-traumatic stress disorder)   Long Term Goal(s): Improvement in symptoms so as ready for discharge  Short Term Goals: Ability to disclose and discuss suicidal ideas  Medication Management: Evaluate patient's response, side effects, and tolerance of medication regimen.  Therapeutic Interventions: 1 to 1 sessions, Unit Group  sessions and Medication administration.  Evaluation of Outcomes: Adequate for Discharge   RN Treatment Plan for Primary Diagnosis: Bipolar disorder, curr episode mixed, severe, with psychotic features ([place]) Long Term Goal(s): Knowledge of disease and therapeutic regimen to maintain health will improve  Short Term Goals: Ability to verbalize feelings will improve  Medication Management: RN will administer medications as ordered by provider, will assess and evaluate patient's response and provide education to patient for prescribed medication. RN will report any adverse and/or side effects to prescribing provider.  Therapeutic Interventions: 1 on 1 counseling sessions, Psychoeducation, Medication administration, Evaluate responses to treatment, Monitor vital signs and CBGs as ordered, Perform/monitor CIWA, COWS, AIMS and Fall Risk screenings as ordered, Perform wound care treatments as ordered.  Evaluation of Outcomes: Adequate for Discharge   LCSW Treatment Plan for Primary Diagnosis: Bipolar disorder, curr episode mixed, severe, with psychotic features ([place]) Long Term Goal(s): Safe transition to appropriate next level of care at discharge, Engage patient in therapeutic group addressing interpersonal concerns.  Short Term Goals: Engage patient in aftercare planning with referrals and resources  Therapeutic Interventions: Assess for all discharge needs, 1 to 1 time with Social worker, Explore available resources and support systems, Assess for adequacy in community support network, Educate family and significant other(s) on suicide prevention, Complete Psychosocial Assessment, Interpersonal group therapy.  Evaluation of Outcomes: Met   Return home, follow up outpt   Progress in Treatment: Attending groups: Yes Participating in groups: Yes Taking medication as prescribed: Yes Toleration medication: Yes, no side effects reported at this time Family/Significant other contact made:  Yes Patient understands diagnosis: Yes AEB asking for help getting back on meds for paranoia Discussing patient identified problems/goals with staff: Yes Medical problems stabilized or resolved: Yes Denies suicidal/homicidal ideation: Yes Issues/concerns per patient self-inventory: None Other: N/A  New problem(s) identified: None identified at this time.   New Short Term/Long Term Goal(s): None identified at this time.   Discharge Plan or Barriers:   Reason for Continuation of Hospitalization: Mood lability Paranoia Medication stabilization Depression  Estimated Length of Stay: 3-5 days  Attendees: Patient: 08/11/2016  1:58 PM  Physician: [name], MD 08/11/2016  1:58 PM  Nursing: Hoy Register, RN 08/11/2016  1:58 PM  RN Care Manager: Lars Pinks, RN 08/11/2016  1:58 PM  Social Worker: Ripley Fraise 08/11/2016  1:58 PM  Recreational Therapist: Laretta Bolster  08/11/2016  1:58 PM  Other: Norberto Sorenson 08/11/2016  1:58 PM  Other:  08/11/2016  1:58 PM    Scribe for Treatment Team:  Roque Lias 08/11/2016 1:58 PM

## 2016-08-11 NOTE — Progress Notes (Signed)
[facility]  08/11/2016 1:08 PM [place] OuchBriann L Ghan  MRN:  [id number]  Subjective: Lamica reports, "I'm ok."   Objective: Meliana L Davisis a 25 y.o.AAfemale, who is single , unemployed , [place] in [place] mother , [place] from [place] , who has a hx of Bipolar disorder and cannabis abuse, [facility] with worsening mood sx and SI.  Pt presented due to  thoughts of self harm.  Rod HollerBriann is seen, chart reviewed. She is alert, oriented & verbally responsive.  Pt discussed with treatment team. She continues to be tearful and labile periodically on the unit - but is seen as progressing. She denies any ADRs to medications , will continue to encourage and support.    Principal Problem: Bipolar disorder, curr episode mixed, severe, with psychotic features (HCC)  Diagnosis:   Patient Active Problem List   Diagnosis Date Noted  . Bipolar disorder, curr episode mixed, severe, with psychotic features (HCC) [F31.64] 08/09/2016  . Cannabis use disorder, severe, dependence (HCC) [F12.20] 08/09/2016  . PTSD (post-traumatic stress disorder) [F43.10] 08/09/2016   Total Time spent with patient: 25 minutes  Past Psychiatric History: Please see H&P.   Past Medical History:  Past Medical History:  Diagnosis Date  . Bipolar disorder (HCC)   . No pertinent past medical history     Past Surgical History:  Procedure Laterality Date  . NO PAST SURGERIES     Family History:  Family History  Problem Relation Age of Onset  . Cancer Paternal Uncle   . Diabetes Paternal Grandmother   . Alcoholism Other   . Bipolar disorder Maternal Grandmother   . Multiple sclerosis Maternal Grandmother   . Other Neg Hx    Family Psychiatric  History: See H&P  Social History: Please see H&P.  History  Alcohol Use  . Yes    Comment: 1 drink every month     History  Drug Use  . Types: Marijuana    Social History   Social History  . Marital status: Single    Spouse name: N/A   . Number of children: N/A  . Years of education: N/A   Social History Main Topics  . Smoking status: Current Every Day Smoker    Packs/day: 0.50    Years: 2.00    Types: Cigarettes  . Smokeless tobacco: Never Used  . Alcohol use Yes     Comment: 1 drink every month  . Drug use:     Types: Marijuana  . Sexual activity: Yes    Birth control/ protection: IUD   Other Topics Concern  . None   Social History Narrative  . None   Additional Social History:    Pain Medications: See MAR Prescriptions: see MAR Over the Counter: See MAR History of alcohol / drug use?: Yes Longest period of sobriety (when/how long): unknown Negative Consequences of Use: Personal relationships Name of Substance 1: THC 1 - Age of First Use: 21 1 - Amount (size/oz): 2 grams 1 - Frequency: once or twice a week 1 - Last Use / Amount: 08/05/16 1 gram Name of Substance 2: etoh 2 - Age of First Use: 20 2 - Amount (size/oz): varies 2 - Frequency: varies 2 - Last Use / Amount: "few weeks ago a sip"  Sleep: Fair  Appetite:  Good  Current Medications: Current Facility-Administered Medications  Medication Dose Route Frequency Provider Last Rate Last Dose  . acetaminophen (TYLENOL) tablet 650 mg  650 mg Oral Q6H  PRN Charm Rings, NP      . alum & mag hydroxide-simeth (MAALOX/MYLANTA) 200-200-20 MG/5ML suspension 30 mL  30 mL Oral Q4H PRN Charm Rings, NP      . levothyroxine (SYNTHROID, LEVOTHROID) tablet 25 mcg  25 mcg Oral QAC breakfast Charm Rings, NP   25 mcg at 08/11/16 4098  . magnesium hydroxide (MILK OF MAGNESIA) suspension 30 mL  30 mL Oral Daily PRN Charm Rings, NP      . Oxcarbazepine (TRILEPTAL) tablet 300 mg  300 mg Oral BID Jomarie Longs, MD   300 mg at 08/11/16 0816  . paliperidone (INVEGA) 24 hr tablet 3 mg  3 mg Oral QHS Jomarie Longs, MD   3 mg at 08/10/16 2122  . traZODone (DESYREL) tablet 50 mg  50 mg Oral QHS PRN Charm Rings, NP       Lab Results: No results found for  this or any previous visit (from the past 48 hour(s)).  Blood Alcohol level:  Lab Results  Component Value Date   ETH <5 08/07/2016   ETH <5 06/01/2016   Metabolic Disorder Labs: Lab Results  Component Value Date   HGBA1C 4.9 06/02/2016   MPG 94 06/02/2016   No results found for: PROLACTIN Lab Results  Component Value Date   CHOL 123 06/02/2016   TRIG 59 06/02/2016   HDL 34 (L) 06/02/2016   CHOLHDL 3.6 06/02/2016   VLDL 12 06/02/2016   LDLCALC 77 06/02/2016   Physical Findings: AIMS: Facial and Oral Movements Muscles of Facial Expression: None, normal Lips and Perioral Area: None, normal Jaw: None, normal Tongue: None, normal,Extremity Movements Upper (arms, wrists, hands, fingers): None, normal Lower (legs, knees, ankles, toes): None, normal, Trunk Movements Neck, shoulders, hips: None, normal, Overall Severity Severity of abnormal movements (highest score from questions above): None, normal Incapacitation due to abnormal movements: None, normal Patient's awareness of abnormal movements (rate only patient's report): No Awareness, Dental Status Current problems with teeth and/or dentures?: No Does patient usually wear dentures?: No  CIWA:  CIWA-Ar Total: 0 COWS:     Musculoskeletal: Strength & Muscle Tone: within normal limits Gait & Station: normal Patient leans: N/A  Psychiatric Specialty Exam: Physical Exam  Constitutional: She appears well-developed.  HENT:  Head: Normocephalic.  Eyes: Pupils are equal, round, and reactive to light.  Neck: Normal range of motion.  Cardiovascular: Normal rate.   Respiratory: Effort normal.  GI: Soft.  Genitourinary:  Genitourinary Comments: Deferred.  Musculoskeletal: Normal range of motion.  Neurological: She is alert.  Skin: Skin is warm.    Review of Systems  Constitutional: Negative.   HENT: Negative.   Eyes: Negative.   Respiratory: Negative.   Cardiovascular: Negative.   Gastrointestinal: Negative.    Genitourinary: Negative.   Musculoskeletal: Negative.   Skin: Negative.   Neurological: Negative.   Endo/Heme/Allergies: Negative.   Psychiatric/Behavioral: Positive for depression and substance abuse (Hx. cannabis use disorder). Negative for hallucinations, memory loss and suicidal ideas. The patient is nervous/anxious. The patient does not have insomnia.   All other systems reviewed and are negative.   Blood pressure (!) 96/57, pulse 95, temperature 98.7 F (37.1 C), temperature source Oral, resp. rate 18, height 5\' 1"  (1.549 m), weight 68.5 kg (151 lb), last menstrual period 08/02/2016.Body mass index is 28.53 kg/m.  Per H&P documentation. General Appearance: Fairly Groomed  Eye Contact:  Fair  Speech:  Normal Rate  Volume:  Decreased  Mood:  Anxious, Depressed and Dysphoric  improving   Affect:  Labile and Tearful- on and off   Thought Process:  Goal Directed and Descriptions of Associations: Circumstantial  Orientation:  Full (Time, Place, and Person)  Thought Content:  Delusions, Ideas of Reference:   Paranoia Delusions, Paranoid Ideation and Rumination- improving   Suicidal Thoughts:   denies  Homicidal Thoughts:  No  Memory:  Immediate;   Fair Recent;   Fair Remote;   Fair  Judgement:  Impaired  Insight:  Shallow  Psychomotor Activity:  Normal  Concentration:  Concentration: Fair and Attention Span: Fair  Recall:  [employer] of Knowledge:  Fair  Language:  Fair  Akathisia:  No  Handed:  Right  AIMS (if indicated):     Assets:  Desire for Improvement  ADL's:  Intact  Cognition:  WNL  Sleep:  Number of Hours: 6.75 hrs   Assessment: Izora L Davisis a 25 y.o.AAfemale, who is single , unemployed , lives in [place] with her mother , recently relocated [place] from [place] , who has a hx of Bipolar disorder and cannabis abuse, presented to [facility] with worsening mood sx and SI.   Pt continues to improve . Treatment Plan Summary: Daily contact with patient to assess and evaluate  symptoms and progress in treatment and Medication management:  Mood control: Will continue the Paliperidone (Invega) 3 mg po Q bedtime.Pt declined Invega sustenna IM . Mood stability: Will continue the Trileptal 300 mg po  bid. Insomnia: Will continue Trazodone 50 mg po prn for insomnia. Hypothyroidism: Will continue the Levothyroxine 25 mcg po  Q am. - Continue 15 minutes observation for safety concerns - Encouraged to participate in milieu therapy and group therapy counseling sessions and also work with coping skills -  Develop treatment plan to decrease risk of relapse upon discharge and to reduce the need for readmission. -  Psycho-social education regarding relapse prevention and self care. - Health care follow up as needed for medical problems. - Restarted home medications where appropriate.  Estle Huguley, MD 08/11/2016, 1:08 PM

## 2016-08-11 NOTE — [facility] Suicide Risk Assessment (Signed)
[facility] INPATIENT:  Family/Significant Other Suicide Prevention Education  Suicide Prevention Education:  Education Completed; Grace Morris, mother, [phone] has been identified by the patient as the family member/significant other with whom the patient will be residing, and identified as the person(s) who will aid the patient in the event of a mental health crisis (suicidal ideations/suicide attempt).  With written consent from the patient, the family member/significant other has been provided the following suicide prevention education, prior to the and/or following the discharge of the patient.  The suicide prevention education provided includes the following:  Suicide risk factors  Suicide prevention and interventions  National Suicide Hotline telephone number  [facility] assessment telephone number  [facility] Emergency Assistance 911  [facility] and/or Residential Mobile Crisis Unit telephone number  Request made of family/significant other to:  Remove weapons (e.g., guns, rifles, knives), all items previously/currently identified as safety concern.    Remove drugs/medications (over-the-counter, prescriptions, illicit drugs), all items previously/currently identified as a safety concern.  The family member/significant other verbalizes understanding of the suicide prevention education information provided.  The family member/significant other agrees to remove the items of safety concern listed above.  Grace Morris 08/11/2016, 2:26 PM

## 2016-08-11 NOTE — Plan of Care (Signed)
Problem: Coping: Goal: Ability to cope will improve Outcome: Progressing Pt denies SI at this time   

## 2016-08-11 NOTE — Progress Notes (Signed)
Adult Psychoeducational Group Note  Date:  08/11/2016 Time:  6:59 PM  Group Topic/Focus:  Recovery Goals:   The focus of this group is to identify appropriate goals for recovery and establish a plan to achieve them.   Participation Level:  Active  Participation Quality:  Appropriate and Attentive  Affect:  Appropriate  Cognitive:  Alert and Appropriate  Insight: Appropriate and Good  Engagement in Group:  Engaged  Modes of Intervention:  Discussion and Education    Mickie Baillizabeth O Iwenekha 08/11/2016, 6:59 PM

## 2016-08-12 MED ORDER — OXCARBAZEPINE 300 MG PO TABS: 300.0000 mg | ORAL_TABLET | Freq: Two times a day (BID) | ORAL | 0 refills | Status: AC

## 2016-08-12 MED ORDER — TRAZODONE HCL 50 MG PO TABS: 25.0000 mg | ORAL_TABLET | Freq: Every evening | ORAL | 0 refills | Status: AC | PRN

## 2016-08-12 MED ORDER — PALIPERIDONE ER 3 MG PO TB24: 3.0000 mg | ORAL_TABLET | Freq: Every day | ORAL | 0 refills | Status: AC

## 2016-08-12 MED ORDER — LEVOTHYROXINE SODIUM 25 MCG PO TABS: 25.0000 ug | ORAL_TABLET | Freq: Every day | ORAL | 0 refills | Status: AC

## 2016-08-12 MED ORDER — TRAZODONE HCL 50 MG PO TABS
25.0000 mg | ORAL_TABLET | Freq: Every evening | ORAL | Status: DC | PRN
  Filled 2016-08-12: qty 4

## 2016-08-12 NOTE — [facility] Group Notes (Signed)
[facility] LCSW Group Therapy  08/12/2016 2:47 PM   Type of Therapy:  Group Therapy   Participation Level:  Engaged  Participation Quality:  Attentive  Affect:  Appropriate   Cognitive:  Alert   Insight:  Engaged  Engagement in Therapy:  Improving   Modes of Intervention:  Education, Exploration, Socialization   Summary of Progress/Problems: Grace Morris was engaged throughout, stayed entire time until she left for discharge.   Grace Morris from [employer] was here to tell his story of recovery, inform patients about [facility] and play his guitar.   Grace Morris 08/12/2016 2:47 PM
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
## 2016-08-12 NOTE — [facility] Suicide Risk Assessment (Signed)
[facility]   Principal Problem: Bipolar disorder, curr episode mixed, severe, with psychotic features [facility](HCC) Discharge Diagnoses:  Patient Active Problem List   Diagnosis Date Noted  . Bipolar disorder, curr episode mixed, severe, with psychotic features (HCC) [F31.64] 08/09/2016  . Cannabis use disorder, severe, dependence (HCC) [F12.20] 08/09/2016  . PTSD (post-traumatic stress disorder) [F43.10] 08/09/2016    Total Time spent with patient: 30 minutes  Musculoskeletal: Strength & Muscle Tone: within normal limits Gait & Station: normal Patient leans: N/A  Psychiatric Specialty Exam: ROS  Blood pressure 108/62, pulse 84, temperature 98.5 F (36.9 C), temperature source Oral, resp. rate 18, height 5\' 1"  (1.549 m), weight 68.5 kg (151 lb), last menstrual period 08/02/2016.Body mass index is 28.53 kg/m.  General Appearance: Fairly Groomed  Patent attorneyye Contact::  Fair  Speech:  Clear and Coherent409  Volume:  Normal  Mood:  Euthymic  Affect:  Congruent  Thought Process:  Goal Directed and Descriptions of Associations: Intact  Orientation:  Full (Time, Place, and Person)  Thought Content:  Logical  Suicidal Thoughts:  No  Homicidal Thoughts:  No  Memory:  Immediate;   Fair Recent;   Fair Remote;   Fair  Judgement:  Fair  Insight:  Fair  Psychomotor Activity:  Normal  Concentration:  Fair  Recall:  FiservFair  Fund of Knowledge:Fair  Language: Fair  Akathisia:  No  Handed:  Right  AIMS (if indicated): 0    Assets:  Desire for Improvement  Sleep:  Number of Hours: 7  Cognition: WNL  ADL's:  Intact   Mental Status Per Nursing Assessment::   On Admission:  NA  Demographic Factors:  Unemployed  Loss Factors: Legal issues  Historical Factors: Impulsivity  Risk Reduction Factors:   Positive social support  Continued Clinical Symptoms:  Previous Psychiatric Diagnoses and Treatments  Cognitive Features That Contribute To Risk:  None    Suicide  Risk:  Minimal: No identifiable suicidal ideation.  Patients presenting with no risk factors but with morbid ruminations; may be classified as minimal risk based on the severity of the depressive symptoms  Follow-up Information    [facility] Follow up on 08/13/2016.   Why:  Two appointments:  First one is Thursday at 10:00 with Shana Chuterystal M, Nurse Practitioner. Second one is Wednedsday, Oct 4 at 11:30 with Glee ArvinLaToya, therapist. Contact information: [address]  [place] [336] [phone]          Plan Of Care/Follow-up recommendations:  Activity:  no restrictions Other:  none  Lakeeta Dobosz, MD 08/12/2016, 9:21 AM

## 2016-08-12 NOTE — Progress Notes (Signed)
  [facility] Adult [facility] Management Discharge Plan :  Will you be returning to the same living situation after discharge:  Yes,  home iwth mother At discharge, do you have transportation home?: Yes,  family or friend Do you have the ability to pay for your medications: Yes,  insurance  Release of information consent forms completed and in the chart;  Patient's signature needed at discharge.  Patient to Follow up at: Follow-up Information    [facility] Follow up on 08/13/2016.   Why:  Two appointments:  First one is Thursday at 10:00 with Shana Chuterystal M, Nurse Practitioner. Second one is Wednedsday, Oct 4 at 11:30 with Glee ArvinLaToya, therapist. Contact information: [address]  [place] [336] [phone]       [facility] .   Why:  I sent your information to them.  Call them on Monday if you have not heard from them.  Delores and Lafonda MossesDiana are the people with whom I spoke. Contact information: [address]  [place] [336] [phone]          Next level of care provider has access to [facility] Link:no  Safety Planning and Suicide Prevention discussed: Yes,  yes  Have you used any form of tobacco in the last 30 days? (Cigarettes, Smokeless Tobacco, Cigars, and/or Pipes): Yes  Has patient been referred to the Quitline?: Patient refused referral  Patient has been referred for addiction treatment: Pt. refused referral  Ida RogueRodney B Aariv Medlock 08/12/2016, 10:11 AM

## 2016-08-12 NOTE — Progress Notes (Signed)
Patient ID: Grace Morris, female   DOB: 10/28/1991, 25 y.o.   MRN: [id number] Patient was discharged to home/self care in the company of her parents.  Patient acknowledged understanding of all discharge instruction and was given sample medications upon discharge.  Patient denies , HI and AVH and states that she feel that she is capable of maintaining her safety in an outpatient community setting.  Treatment compliance was expressed to patient upon discharge and follow up plans included in discharge instructions.

## 2016-10-11 ENCOUNTER — Ambulatory Visit (HOSPITAL_COMMUNITY)
Admission: RE | Admit: 2016-10-11 | Discharge: 2016-10-11 | Disposition: A | Discharge status: Home / Self Care | Payer: Federal, State, Local not specified - Other | Attending: Psychiatry | Admitting: Psychiatry

## 2016-10-11 ENCOUNTER — Encounter (HOSPITAL_COMMUNITY): Payer: Self-pay | Admitting: Emergency Medicine

## 2016-10-11 DIAGNOSIS — Z1389 Encounter for screening for other disorder: Secondary | ICD-10-CM | POA: Insufficient documentation

## 2016-10-11 DIAGNOSIS — F319 Bipolar disorder, unspecified: Secondary | ICD-10-CM | POA: Insufficient documentation
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
## 2016-10-11 NOTE — H&P (Signed)
Behavioral Health Medical Screening Exam  Grace Morris is an 25 y.o. female [facility] team[facility] after texting her Bible study lady that Arlys JohnBrian has died. The pt states "I made a stupid decision and texted her because I was angry that she wouldn't return my calls. I don't really feel that I want to hurt myself. It was just a dumb decision."   Total Time spent with patient: 20 minutes  Psychiatric Specialty Exam: Physical Exam  Constitutional: She is oriented to person, place, and time. She appears well-developed and well-nourished.  HENT:  Head: Normocephalic.  Eyes: Pupils are equal, round, and reactive to light.  Neck: Normal range of motion.  Cardiovascular: Normal rate and normal heart sounds.   Respiratory: Effort normal.  GI: Soft. Bowel sounds are normal.  Musculoskeletal: Normal range of motion.  Neurological: She is alert and oriented to person, place, and time.  Skin: Skin is warm and dry.    Review of Systems  Psychiatric/Behavioral: Positive for depression. Negative for hallucinations, memory loss, substance abuse and suicidal ideas. The patient is not nervous/anxious and does not have insomnia.   All other systems reviewed and are negative.   BP 107/63   Pulse 79   Temp 98.6 F (37 C) (Oral)   Resp 18   SpO2 100%    General Appearance: Casual and Fairly Groomed  Eye Contact:  Good  Speech:  Clear and Coherent and Normal Rate  Volume:  Normal  Mood:  Depressed  Affect:  Congruent and Depressed  Thought Process:  Coherent, Goal Directed and Linear  Orientation:  Full (Time, Place, and Person)  Thought Content:  Logical  Suicidal Thoughts:  No  Homicidal Thoughts:  No  Memory:  Immediate;   Good Recent;   Fair Remote;   Fair  Judgement:  Fair  Insight:  Fair  Psychomotor Activity:  Normal  Concentration: Concentration: Good and Attention Span: Good  Recall:  Good  Fund of Knowledge:Good  Language: Good  Akathisia:  No   Handed:  Right  AIMS (if indicated):     Assets:  Communication Skills Desire for Improvement Financial Resources/Insurance Housing Physical Health Resilience Social Support Vocational/Educational  Sleep:       Musculoskeletal: Strength & Muscle Tone: within normal limits Gait & Station: normal Patient leans: N/A  BP 107/63   Pulse 79   Temp 98.6 F (37 C) (Oral)   Resp 18   SpO2 100%   Recommendations:  Based on my evaluation the patient does not appear to have an emergency medical condition.  Laveda AbbeLaurie Britton Yuval Nolet, NP 10/11/2016, 2:47 PM
